# Patient Record
Sex: Female | Born: 1995 | Hispanic: Yes | Smoking: Former smoker
Health system: Southern US, Community
[De-identification: ages and names within clinical notes are randomized; demographics above are authoritative.]

## PROBLEM LIST (undated history)

## (undated) DIAGNOSIS — Z789 Other specified health status: Secondary | ICD-10-CM

## (undated) HISTORY — PX: NO PAST SURGERIES: SHX2092

---

## 2016-05-21 ENCOUNTER — Emergency Department (HOSPITAL_COMMUNITY): Payer: No Typology Code available for payment source

## 2016-05-21 ENCOUNTER — Encounter (HOSPITAL_COMMUNITY): Payer: Self-pay | Admitting: Emergency Medicine

## 2016-05-21 ENCOUNTER — Emergency Department (HOSPITAL_COMMUNITY)
Admission: EM | Admit: 2016-05-21 | Discharge: 2016-05-21 | Disposition: A | Discharge status: Home / Self Care | Payer: No Typology Code available for payment source | Attending: Emergency Medicine | Admitting: Emergency Medicine

## 2016-05-21 DIAGNOSIS — Y939 Activity, unspecified: Secondary | ICD-10-CM | POA: Diagnosis not present

## 2016-05-21 DIAGNOSIS — Y92410 Unspecified street and highway as the place of occurrence of the external cause: Secondary | ICD-10-CM | POA: Insufficient documentation

## 2016-05-21 DIAGNOSIS — Y999 Unspecified external cause status: Secondary | ICD-10-CM | POA: Diagnosis not present

## 2016-05-21 DIAGNOSIS — S0083XA Contusion of other part of head, initial encounter: Secondary | ICD-10-CM | POA: Diagnosis not present

## 2016-05-21 DIAGNOSIS — M542 Cervicalgia: Secondary | ICD-10-CM | POA: Insufficient documentation

## 2016-05-21 DIAGNOSIS — S0990XA Unspecified injury of head, initial encounter: Secondary | ICD-10-CM | POA: Diagnosis present

## 2016-05-21 MED ORDER — KETOROLAC TROMETHAMINE 30 MG/ML IJ SOLN
30.0000 mg | Freq: Once | INTRAMUSCULAR | Status: AC
  Administered 2016-05-21: 30 mg via INTRAMUSCULAR
  Filled 2016-05-21: qty 1

## 2016-05-21 MED ORDER — IBUPROFEN 800 MG PO TABS: 800.0000 mg | ORAL_TABLET | Freq: Three times a day (TID) | ORAL | Status: DC
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
## 2016-05-21 NOTE — ED Notes (Signed)
Patient transported to CT 
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
## 2016-05-21 NOTE — ED Notes (Signed)
Pt also with small abrasion to right elbow

## 2016-05-21 NOTE — ED Notes (Signed)
Patient transported to X-ray 
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
## 2016-10-14 ENCOUNTER — Emergency Department (HOSPITAL_COMMUNITY)
Admission: EM | Admit: 2016-10-14 | Discharge: 2016-10-15 | Disposition: A | Discharge status: Home / Self Care | Payer: Medicaid Other | Attending: Emergency Medicine | Admitting: Emergency Medicine

## 2016-10-14 ENCOUNTER — Encounter (HOSPITAL_COMMUNITY): Payer: Self-pay | Admitting: Adult Health

## 2016-10-14 DIAGNOSIS — T40601A Poisoning by unspecified narcotics, accidental (unintentional), initial encounter: Secondary | ICD-10-CM

## 2016-10-14 DIAGNOSIS — F17200 Nicotine dependence, unspecified, uncomplicated: Secondary | ICD-10-CM | POA: Diagnosis not present

## 2016-10-14 DIAGNOSIS — T404X1A Poisoning by other synthetic narcotics, accidental (unintentional), initial encounter: Secondary | ICD-10-CM | POA: Diagnosis not present

## 2016-10-14 LAB — CBC WITH DIFFERENTIAL/PLATELET
Basophils Absolute: 0 10*3/uL (ref 0.0–0.1)
Basophils Relative: 0 %
EOS ABS: 0 10*3/uL (ref 0.0–0.7)
EOS PCT: 0 %
HCT: 37 % (ref 36.0–46.0)
Hemoglobin: 12.7 g/dL (ref 12.0–15.0)
LYMPHS ABS: 3.2 10*3/uL (ref 0.7–4.0)
Lymphocytes Relative: 27 %
MCH: 30.2 pg (ref 26.0–34.0)
MCHC: 34.3 g/dL (ref 30.0–36.0)
MCV: 88.1 fL (ref 78.0–100.0)
MONOS PCT: 6 %
Monocytes Absolute: 0.7 10*3/uL (ref 0.1–1.0)
Neutro Abs: 7.8 10*3/uL — ABNORMAL HIGH (ref 1.7–7.7)
Neutrophils Relative %: 67 %
PLATELETS: 336 10*3/uL (ref 150–400)
RBC: 4.2 MIL/uL (ref 3.87–5.11)
RDW: 13.2 % (ref 11.5–15.5)
WBC: 11.8 10*3/uL — AB (ref 4.0–10.5)

## 2016-10-14 LAB — COMPREHENSIVE METABOLIC PANEL
ALK PHOS: 64 U/L (ref 38–126)
ALT: 16 U/L (ref 14–54)
AST: 21 U/L (ref 15–41)
Albumin: 4.5 g/dL (ref 3.5–5.0)
Anion gap: 9 (ref 5–15)
BUN: 10 mg/dL (ref 6–20)
CALCIUM: 9.5 mg/dL (ref 8.9–10.3)
CHLORIDE: 104 mmol/L (ref 101–111)
CO2: 23 mmol/L (ref 22–32)
CREATININE: 0.74 mg/dL (ref 0.44–1.00)
GFR calc Af Amer: >60 mL/min (ref >60)
Glucose, Bld: 125 mg/dL — ABNORMAL HIGH (ref 65–99)
Potassium: 3.5 mmol/L (ref 3.5–5.1)
Sodium: 136 mmol/L (ref 135–145)
Total Bilirubin: 0.4 mg/dL (ref 0.3–1.2)
Total Protein: 7.6 g/dL (ref 6.5–8.1)

## 2016-10-14 LAB — ETHANOL

## 2016-10-14 LAB — ACETAMINOPHEN LEVEL

## 2016-10-14 LAB — SALICYLATE LEVEL: Salicylate Lvl: <7 mg/dL (ref 2.8–30.0)

## 2016-10-14 MED ORDER — ONDANSETRON 4 MG PO TBDP
4.0000 mg | ORAL_TABLET | Freq: Once | ORAL | Status: AC
  Administered 2016-10-14: 4 mg via ORAL
  Filled 2016-10-14: qty 1

## 2016-10-14 MED ORDER — ONDANSETRON HCL 4 MG/2ML IJ SOLN
4.0000 mg | Freq: Once | INTRAMUSCULAR | Status: DC
Start: 2016-10-14 — End: 2016-10-14
  Filled 2016-10-14: qty 2
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
## 2016-10-14 NOTE — ED Notes (Signed)
Patient aware that urine is needed.  To call when able.

## 2016-10-15 LAB — POC URINE PREG, ED: Preg Test, Ur: NEGATIVE

## 2016-10-15 LAB — RAPID URINE DRUG SCREEN, HOSP PERFORMED
Amphetamines: NOT DETECTED
BARBITURATES: NOT DETECTED
Benzodiazepines: NOT DETECTED
COCAINE: NOT DETECTED
Opiates: NOT DETECTED
TETRAHYDROCANNABINOL: NOT DETECTED

## 2016-10-15 NOTE — ED Notes (Signed)
Spoke with poison control.  Updated on results at this time.  Recommends patient be watched until 0300 and they will call back then to clear patient.

## 2016-10-15 NOTE — ED Notes (Signed)
Pt ambulated to bathroom. Instructions for collecting urine sample verbalized.

## 2016-10-15 NOTE — Discharge Instructions (Signed)
Do not take medication that was prescribed for someone else. When you take prescription medication, take it exactly according to the directions given to you.  For your back pain, take ibuprofen, naproxen, or acetaminophen. Apply ice to the area that is painful.
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
## 2021-04-28 ENCOUNTER — Other Ambulatory Visit: Payer: Self-pay

## 2021-04-28 ENCOUNTER — Encounter: Payer: Self-pay | Admitting: Advanced Practice Midwife

## 2021-04-28 ENCOUNTER — Ambulatory Visit: Payer: Self-pay | Admitting: Advanced Practice Midwife

## 2021-04-28 VITALS — BP 118/77 | HR 74 | Ht 63.0 in | Wt 152.0 lb

## 2021-04-28 DIAGNOSIS — B379 Candidiasis, unspecified: Secondary | ICD-10-CM

## 2021-04-28 MED ORDER — FLUCONAZOLE 150 MG PO TABS: ORAL_TABLET | ORAL | 2 refills | Status: DC

## 2021-04-28 MED ORDER — FLUCONAZOLE 150 MG PO TABS: 150.0000 mg | ORAL_TABLET | Freq: Once | ORAL | 0 refills | Status: DC

## 2021-04-28 NOTE — Progress Notes (Signed)
   [facility] Visit  Patient name: Kathleen Schmidt MRN [id number]  Date of birth: Apr 14, 1996  CC & HPI:   25 y.o. female complains of white and curd-like vaginal discharge for about a month . Denies abnormal vaginal bleeding, significant pelvic pain or fever. No UTI symptoms. Sexually active, does not use condoms, no change in partner.  Last unprotected intercourse __ days ago.  Denies history of known exposure to STD or symptoms in partner.  Patient's last menstrual period was 04/01/2021 (exact date).  No history of STD's.  Pertinent History Reviewed:  Medical & Surgical Hx:   History reviewed. No pertinent past medical history. History reviewed. No pertinent surgical history. Family History  Problem Relation Age of Onset  . Diabetes Maternal Grandmother   . Stroke Maternal Grandfather   . Kidney failure Mother   . Hypertension Mother   . Diabetes Mother   . Hypercholesterolemia Mother     Current Outpatient Medications:  .  ibuprofen (ADVIL,MOTRIN) 800 MG tablet, Take 1 tablet (800 mg total) by mouth 3 (three) times daily. Take one tablet three times daily for three days, Disp: 15 tablet, Rfl: 0 Social History: Reviewed -  reports that she quit smoking about 3 years ago. She has never used smokeless tobacco.  Review of Systems:   Constitutional: Negative for fever and chills Eyes: Negative for visual disturbances Respiratory: Negative for shortness of breath, dyspnea Cardiovascular: Negative for chest pain or palpitations  Gastrointestinal: Negative for vomiting, diarrhea and constipation; no abdominal pain Genitourinary: Negative for dysuria and urgency Musculoskeletal: Negative for back pain, joint pain, myalgias  Neurological: Negative for dizziness and headaches    Objective Findings:  Vitals: BP 118/77 (BP Location: Right Arm, Patient Position: Sitting, Cuff Size: Normal)   Pulse 74   Ht 5\' 3"  (1.6 m)   Wt 152 lb (68.9 kg)   LMP 04/01/2021 (Exact Date)    BMI 26.93 kg/m   Physical Examination: General appearance - well appearing, and in no distress Mental status - alert, oriented to person, place, and time Chest:  Normal respiratory effort Heart - normal rate and regular rhythm Abdomen:  Soft, nontender Pelvic: Vulva neg, SSE:  Thick white discharge with no odor.  Wet prep  yeast, no clue, no WBC, no trich. Musculoskeletal:  Normal range of motion without pain Extremities:  No edema    No results found for this or any previous visit (from the past 24 hour(s)).    Assessment & Plan:  A:   Yeast P:  rx diflucan   No follow-ups on file.  04/03/2021 CNM 04/28/2021 12:03 PM

## 2021-05-24 ENCOUNTER — Telehealth: Payer: Self-pay | Admitting: Advanced Practice Midwife
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
## 2021-05-24 NOTE — Telephone Encounter (Signed)
Call can't be completed @ 1:25 pm. JSY

## 2021-05-25 NOTE — Telephone Encounter (Signed)
Call can't be completed @ 12:40 pm. JSY

## 2021-05-26 NOTE — Telephone Encounter (Signed)
Call can't be completed @ 11:44 am. 3 attempts to reach pt. Closing encounter. JSY

## 2021-05-30 ENCOUNTER — Telehealth: Payer: Self-pay | Admitting: Advanced Practice Midwife

## 2021-05-30 NOTE — Telephone Encounter (Signed)
PT CALLING STATING THAT SHE HAS COMPLETED HER MEDS  BUT DIDN'T CLEAR UP WANTING TO KNOW IS SOMEONE WILL CALL HER BACK OR SEND SOMETHING IN TO [facility]

## 2021-05-30 NOTE — Telephone Encounter (Signed)
Attempted to return patient's call but call could not be completed at this time. Will try again tomorrow.

## 2021-05-31 NOTE — Telephone Encounter (Signed)
Attempted to return patient's call but heard "call cannot be completed at this time.

## 2021-06-02 ENCOUNTER — Telehealth: Payer: Self-pay | Admitting: Advanced Practice Midwife

## 2021-06-02 NOTE — Telephone Encounter (Signed)
Patient states that she called two days in a roll with no nurse calling her back, I spoke with patient and informed her that my nurse has tried to call and it did not go through. Paitent gave a new number. Please contact pt

## 2021-06-02 NOTE — Telephone Encounter (Signed)
Returned patient's call. States same symptoms as last time.  Informed she has 2 refills left on last prescription that was sent and can give the pharmacy a call to have it refilled.  Pt verbalized understanding.

## 2021-12-04 NOTE — L&D Delivery Note (Signed)
Delivery Note Progressed to complete dilation and pushed for 40 minutes to delivery Neonatologist called to attend delivery due to MSAF  At 4:33 AM a viable and healthy female was delivered via Vaginal, Spontaneous (Presentation: Left Occiput Anterior).  APGAR: 8, 9; weight  .   Placenta status: Spontaneous, Intact.  Cord: 3 vessels with the following complications: None.   Baby did well and was deemed healthy by Neonatologist to stay in room with parents  Anesthesia: Epidural Episiotomy: None Lacerations: 1st degree;Perineal, small periclitoral  Suture Repair: 3.0 monocryl  Est. Blood Loss (mL): 218  Mom to postpartum.  Baby to Couplet care / Skin to Skin.  Wynelle Bourgeois 07/14/2022, 5:27 AM

## 2021-12-06 ENCOUNTER — Ambulatory Visit (INDEPENDENT_AMBULATORY_CARE_PROVIDER_SITE_OTHER): Payer: Self-pay

## 2021-12-06 ENCOUNTER — Other Ambulatory Visit: Payer: Self-pay

## 2021-12-06 VITALS — BP 112/72 | HR 59 | Ht 63.0 in | Wt 148.4 lb

## 2021-12-06 DIAGNOSIS — Z3200 Encounter for pregnancy test, result unknown: Secondary | ICD-10-CM

## 2021-12-06 DIAGNOSIS — Z3201 Encounter for pregnancy test, result positive: Secondary | ICD-10-CM

## 2021-12-06 LAB — POCT URINE PREGNANCY: Preg Test, Ur: POSITIVE — AB

## 2021-12-06 NOTE — Progress Notes (Signed)
° °  NURSE VISIT- PREGNANCY CONFIRMATION   SUBJECTIVE:  Kathleen Schmidt is a 26 y.o. G1P0000 female at [redacted]w[redacted]d by certain LMP of Patient's last menstrual period was 10/08/2021 (exact date). Here for pregnancy confirmation.  Home pregnancy test: positive x 3   She reports nausea and vomiting.  She is not taking prenatal vitamins.    OBJECTIVE:  BP 112/72 (BP Location: Right Arm, Patient Position: Sitting, Cuff Size: Normal)    Pulse (!) 59    Ht 5\' 3"  (1.6 m)    Wt 148 lb 6.4 oz (67.3 kg)    LMP 10/08/2021 (Exact Date)    BMI 26.29 kg/m   Appears well, in no apparent distress  Results for orders placed or performed in visit on 12/06/21 (from the past 24 hour(s))  POCT urine pregnancy   Collection Time: 12/06/21  3:33 PM  Result Value Ref Range   Preg Test, Ur Positive (A) Negative    ASSESSMENT: Positive pregnancy test, [redacted]w[redacted]d by LMP    PLAN: Schedule for dating ultrasound in 1-2 weeks Prenatal vitamins: plans to begin OTC ASAP   Nausea medicines: not currently needed   OB packet given: Yes  Kimla Furth A Jayvan Mcshan  12/06/2021 3:39 PM

## 2021-12-06 NOTE — Progress Notes (Signed)
Chart reviewed for nurse visit. Agree with plan of care.  Adline Potter, NP 12/06/2021 4:59 PM

## 2021-12-17 ENCOUNTER — Inpatient Hospital Stay (HOSPITAL_COMMUNITY)
Admission: AD | Admit: 2021-12-17 | Discharge: 2021-12-17 | Disposition: A | Discharge status: Home / Self Care | Payer: Medicaid Other | Attending: Obstetrics and Gynecology | Admitting: Obstetrics and Gynecology

## 2021-12-17 ENCOUNTER — Other Ambulatory Visit: Payer: Self-pay

## 2021-12-17 ENCOUNTER — Encounter (HOSPITAL_COMMUNITY): Payer: Self-pay | Admitting: Family Medicine

## 2021-12-17 DIAGNOSIS — O26891 Other specified pregnancy related conditions, first trimester: Secondary | ICD-10-CM | POA: Insufficient documentation

## 2021-12-17 DIAGNOSIS — Z3A10 10 weeks gestation of pregnancy: Secondary | ICD-10-CM | POA: Diagnosis not present

## 2021-12-17 DIAGNOSIS — M79605 Pain in left leg: Secondary | ICD-10-CM | POA: Diagnosis present

## 2021-12-17 DIAGNOSIS — R252 Cramp and spasm: Secondary | ICD-10-CM

## 2021-12-17 DIAGNOSIS — O99891 Other specified diseases and conditions complicating pregnancy: Secondary | ICD-10-CM

## 2021-12-17 HISTORY — DX: Other specified health status: Z78.9

## 2021-12-17 NOTE — MAU Provider Note (Addendum)
History     CSN: [id number]  Arrival date and time: 12/17/21 0042   Event Date/Time   First Provider Initiated Contact with Patient 12/17/21 0102      Chief Complaint  Patient presents with   Leg Pain   HPI Angelinne Lecates is a 26 y.o. G1P0000 at [redacted]w[redacted]d who presents to MAU with chief complaint of left leg "cramp". This is a new problem. Onset the morning of 12/16/2021. Patient states it feels as if her left calf is "about to have a cramp". She spent Friday running errands with her mom which involved a lot of time in the car. She states her left calf became more uncomfortable throughout the day. She denies aggravating or alleviating factors. She has not taken medication or tried other treatments for this complaint.  She denies unilateral swelling, calf discoloration, recent immobilization, SOB, chest pain, weakness, syncope.  Patient has confirmed pregnancy with [facility].  OB History     Gravida  1   Para  0   Term  0   Preterm  0   AB  0   Living  0      SAB  0   IAB  0   Ectopic  0   Multiple  0   Live Births  0           Past Medical History:  Diagnosis Date   Medical history non-contributory     Past Surgical History:  Procedure Laterality Date   NO PAST SURGERIES      Family History  Problem Relation Age of Onset   Diabetes Maternal Grandmother    Stroke Maternal Grandfather    Kidney failure Mother    Hypertension Mother    Diabetes Mother    Hypercholesterolemia Mother     Social History   Tobacco Use   Smoking status: Former    Types: Cigarettes    Quit date: 2019    Years since quitting: 4.0   Smokeless tobacco: Never  Vaping Use   Vaping Use: Never used  Substance Use Topics   Alcohol use: No   Drug use: No    Allergies: No Known Allergies  No medications prior to admission.    Review of Systems  Musculoskeletal:  Positive for gait problem.  All other systems reviewed and are negative. Physical Exam    Blood pressure 120/81, pulse 70, temperature 98.3 F (36.8 C), resp. rate 17, height 5\' 3"  (1.6 m), weight 66.2 kg, last menstrual period 10/08/2021, SpO2 100 %.  Physical Exam Vitals and nursing note reviewed.  Constitutional:      Appearance: Normal appearance.  Cardiovascular:     Rate and Rhythm: Normal rate.     Pulses: Normal pulses.  Pulmonary:     Effort: Pulmonary effort is normal.  Abdominal:     General: Abdomen is flat.  Musculoskeletal:     Right lower leg: Normal. No swelling. No edema.     Left lower leg: Normal. No swelling. No edema.     Right ankle: No swelling.     Right Achilles Tendon: Normal.     Left ankle: Normal. No swelling.     Left Achilles Tendon: Normal.     Right foot: Normal.     Left foot: Normal.  Skin:    Capillary Refill: Capillary refill takes less than 2 seconds.  Neurological:     Mental Status: She is alert and oriented to person, place, and time.  Psychiatric:  Mood and Affect: Mood normal.        Behavior: Behavior normal.        Thought Content: Thought content normal.        Judgment: Judgment normal.   Measurements symmetrical at distal tibial tuberosity, widest part of calf, and distal tibia  MAU Course  Procedures  --Pertinent negatives: unilateral swelling, discoloration, elevated BMI, recent immobilization, SOB --CNM at bedside to discuss Wells Score of 0, low suspicion of DVT. Also reviewed MAU overnight protocol for ordering Doppler for administration later this morning, Lovenox prior to discharge from MAU. --Patient and partner decline Lovenox, decline order for Doppler imaging later this morning --Also discussed date for muscle cramps and interventions. Per Up To Date gentle stretching advised over placebo affect associated with magnesium supplementation  Patient Vitals for the past 24 hrs:  BP Temp Pulse Resp SpO2 Height Weight  12/17/21 0134 110/68 -- 79 16 -- -- --  12/17/21 0054 120/81 98.3 F (36.8 C)  70 17 100 % 5\' 3"  (1.6 m) 66.2 kg   Pretest probability of deep vein thrombosis (Wells score) Clinical feature Score  Active cancer (treatment ongoing or within the previous six months or palliative) 1  Paralysis, paresis, or recent plaster immobilization of the lower extremities 1  Recently bedridden for more than three days or major surgery, within four weeks 1  Localized tenderness along the distribution of the deep venous system 1  Entire leg swollen 1  Calf swelling by more than 3 cm when compared to the asymptomatic leg (measured below tibial tuberosity) 1  Pitting edema (greater in the symptomatic leg) 1  Collateral superficial veins (nonvaricose) 1  Alternative diagnosis as likely or more likely than that of deep venous thrombosis -2  Score  High probability  3 or greater  Moderate probability 1 or 2  Low probability 0 or less  Modification:  This clinical model has been modified to take one other clinical feature into account: a previously documented deep vein thrombosis (DVT) is given the score of 1. Using this modified scoring system, DVT is either likely or unlikely, as follows:  DVT likely 2 or greater   DVT unlikely 1 or less  Adapted from: Lendon Collar, Anderson DR, Bormanis J, et al. Value of assessment of pretest probability of deep-vein thrombosis in clinical management. Lancet 1997; [id number] Wells PS, Anderson,DR, Lilia Argue, et al. Evaluation of D-dimer in the diagnosis of suspected deep-vein thrombosis. [place] [employer] Med 2003; [id number].  Assessment and Plan  --26 y.o. G1P0000 at [redacted]w[redacted]d  --Left calf cramps --Patient declines Lovenox and Doppler imaging --Discharge home in stable condition with return precautions  Darlina Rumpf, [place] 12/17/2021, 1:54 AM

## 2021-12-17 NOTE — MAU Note (Signed)
This morning my L leg was hurting and felt like it was going to cramp up. Leg continued to hurt all day in my left calf. Worse with walking. No pregnancy concerns

## 2021-12-17 NOTE — Progress Notes (Signed)
Thalia Bloodgood CNM in earlier to discuss plan of care. Written and verbal d/c instructions given and understanding voiced

## 2021-12-21 ENCOUNTER — Other Ambulatory Visit: Payer: Self-pay | Admitting: Obstetrics & Gynecology

## 2021-12-21 DIAGNOSIS — O3680X0 Pregnancy with inconclusive fetal viability, not applicable or unspecified: Secondary | ICD-10-CM

## 2021-12-22 ENCOUNTER — Other Ambulatory Visit: Payer: Self-pay

## 2021-12-22 ENCOUNTER — Ambulatory Visit (INDEPENDENT_AMBULATORY_CARE_PROVIDER_SITE_OTHER): Payer: Medicaid Other

## 2021-12-22 DIAGNOSIS — Z3A10 10 weeks gestation of pregnancy: Secondary | ICD-10-CM | POA: Diagnosis not present

## 2021-12-22 DIAGNOSIS — O3680X0 Pregnancy with inconclusive fetal viability, not applicable or unspecified: Secondary | ICD-10-CM

## 2021-12-22 NOTE — Progress Notes (Signed)
[place] 10+5 wks,single IUP with YS,CRL 35.36 mm,FHR 164 bpm,normal ovaries

## 2022-01-03 DIAGNOSIS — Z3400 Encounter for supervision of normal first pregnancy, unspecified trimester: Secondary | ICD-10-CM | POA: Insufficient documentation

## 2022-01-04 ENCOUNTER — Other Ambulatory Visit: Payer: Self-pay | Admitting: Obstetrics & Gynecology

## 2022-01-04 DIAGNOSIS — Z3682 Encounter for antenatal screening for nuchal translucency: Secondary | ICD-10-CM

## 2022-01-06 ENCOUNTER — Encounter: Payer: Self-pay | Admitting: Women's Health

## 2022-01-06 ENCOUNTER — Other Ambulatory Visit: Payer: Self-pay

## 2022-01-06 ENCOUNTER — Ambulatory Visit (INDEPENDENT_AMBULATORY_CARE_PROVIDER_SITE_OTHER): Payer: Medicaid Other | Admitting: Women's Health

## 2022-01-06 ENCOUNTER — Ambulatory Visit (INDEPENDENT_AMBULATORY_CARE_PROVIDER_SITE_OTHER): Payer: Medicaid Other

## 2022-01-06 ENCOUNTER — Ambulatory Visit: Payer: Medicaid Other | Admitting: *Deleted

## 2022-01-06 VITALS — BP 114/54 | HR 68 | Wt 149.0 lb

## 2022-01-06 DIAGNOSIS — Z131 Encounter for screening for diabetes mellitus: Secondary | ICD-10-CM | POA: Diagnosis not present

## 2022-01-06 DIAGNOSIS — Z3682 Encounter for antenatal screening for nuchal translucency: Secondary | ICD-10-CM

## 2022-01-06 DIAGNOSIS — Z3401 Encounter for supervision of normal first pregnancy, first trimester: Secondary | ICD-10-CM

## 2022-01-06 DIAGNOSIS — Z3402 Encounter for supervision of normal first pregnancy, second trimester: Secondary | ICD-10-CM

## 2022-01-06 DIAGNOSIS — Z3A12 12 weeks gestation of pregnancy: Secondary | ICD-10-CM | POA: Diagnosis not present

## 2022-01-06 DIAGNOSIS — Z6826 Body mass index (BMI) 26.0-26.9, adult: Secondary | ICD-10-CM | POA: Diagnosis not present

## 2022-01-06 NOTE — Addendum Note (Signed)
Addended by: Moss Mc on: 01/06/2022 01:08 PM   Modules accepted: Orders

## 2022-01-06 NOTE — Patient Instructions (Signed)
Kathleen Schmidt, thank you for choosing our office today! We appreciate the opportunity to meet your healthcare needs. You may receive a short survey by mail, e-mail, or through [employer]. If you are happy with your care we would appreciate if you could take just a few minutes to complete the survey questions. We read all of your comments and take your feedback very seriously. Thank you again for choosing our office.  Center for [employer] [facility] at [facility]  [name] at [facility] ([address] [place], [place] [address]) Entrance C, located off of [address] Free 24/7 valet parking   Nausea & Vomiting Have saltine crackers or pretzels by your bed and eat a few bites before you raise your head out of bed in the morning Eat small frequent meals throughout the day instead of large meals Drink plenty of fluids throughout the day to stay hydrated, just don't drink a lot of fluids with your meals.  This can make your stomach fill up faster making you feel sick Do not brush your teeth right after you eat Products with real ginger are good for nausea, like ginger ale and ginger hard candy Make sure it says made with real ginger! Sucking on sour candy like lemon heads is also good for nausea If your prenatal vitamins make you nauseated, take them at night so you will sleep through the nausea Sea Bands If you feel like you need medicine for the nausea & vomiting please let us know If you are unable to keep any fluids or food down please let us know   Constipation Drink plenty of fluid, preferably water, throughout the day Eat foods high in fiber such as fruits, vegetables, and grains Exercise, such as walking, is a good way to keep your bowels regular Drink warm fluids, especially warm prune juice, or decaf coffee Eat a 1/2 cup of real oatmeal (not instant), 1/2 cup applesauce, and 1/2-1 cup warm prune juice every day If needed, you may take Colace (docusate sodium) stool softener  once or twice a day to help keep the stool soft.  If you still are having problems with constipation, you may take Miralax once daily as needed to help keep your bowels regular.   Home Blood Pressure Monitoring for Patients   Your provider has recommended that you check your blood pressure (BP) at least once a week at home. If you do not have a blood pressure cuff at home, one will be provided for you. Contact your provider if you have not received your monitor within 1 week.   Helpful Tips for Accurate Home Blood Pressure Checks  Don't smoke, exercise, or drink caffeine 30 minutes before checking your BP Use the restroom before checking your BP (a full bladder can raise your pressure) Relax in a comfortable upright chair Feet on the ground Left arm resting comfortably on a flat surface at the level of your heart Legs uncrossed Back supported Sit quietly and don't talk Place the cuff on your bare arm Adjust snuggly, so that only two fingertips can fit between your skin and the top of the cuff Check 2 readings separated by at least one minute Keep a log of your BP readings For a visual, please reference this diagram: http://ccnc.care/bpdiagram  Provider Name: [facility]     Phone: [phone]  Zone 1: ALL CLEAR  Continue to monitor your symptoms:  BP reading is less than 140 (top number) or less than 90 (bottom  number)  No right upper stomach pain No headaches or seeing spots No feeling nauseated or throwing up No swelling in face and hands  Zone 2: CAUTION Call your doctor's office for any of the following:  BP reading is greater than 140 (top number) or greater than 90 (bottom number)  Stomach pain under your ribs in the middle or right side Headaches or seeing spots Feeling nauseated or throwing up Swelling in face and hands  Zone 3: EMERGENCY  Seek immediate medical care if you have any of the following:  BP reading is greater than160 (top number) or greater than  110 (bottom number) Severe headaches not improving with Tylenol Serious difficulty catching your breath Any worsening symptoms from Zone 2    First Trimester of Pregnancy The first trimester of pregnancy is from week 1 until the end of week 12 (months 1 through 3). A week after a sperm fertilizes an egg, the egg will implant on the wall of the uterus. This embryo will begin to develop into a baby. Genes from you and your partner are forming the baby. The female genes determine whether the baby is a boy or a girl. At 6-8 weeks, the eyes and face are formed, and the heartbeat can be seen on ultrasound. At the end of 12 weeks, all the baby's organs are formed.  Now that you are pregnant, you will want to do everything you can to have a healthy baby. Two of the most important things are to get good prenatal care and to follow your health care provider's instructions. Prenatal care is all the medical care you receive before the baby's birth. This care will help prevent, find, and treat any problems during the pregnancy and childbirth. BODY CHANGES Your body goes through many changes during pregnancy. The changes vary from woman to woman.  You may gain or lose a couple of pounds at first. You may feel sick to your stomach (nauseous) and throw up (vomit). If the vomiting is uncontrollable, call your health care provider. You may tire easily. You may develop headaches that can be relieved by medicines approved by your health care provider. You may urinate more often. Painful urination may mean you have a bladder infection. You may develop heartburn as a result of your pregnancy. You may develop constipation because certain hormones are causing the muscles that push waste through your intestines to slow down. You may develop hemorrhoids or swollen, bulging veins (varicose veins). Your breasts may begin to grow larger and become tender. Your nipples may stick out more, and the tissue that surrounds them  (areola) may become darker. Your gums may bleed and may be sensitive to brushing and flossing. Dark spots or blotches (chloasma, mask of pregnancy) may develop on your face. This will likely fade after the baby is born. Your menstrual periods will stop. You may have a loss of appetite. You may develop cravings for certain kinds of food. You may have changes in your emotions from day to day, such as being excited to be pregnant or being concerned that something may go wrong with the pregnancy and baby. You may have more vivid and strange dreams. You may have changes in your hair. These can include thickening of your hair, rapid growth, and changes in texture. Some women also have hair loss during or after pregnancy, or hair that feels dry or thin. Your hair will most likely return to normal after your baby is born. WHAT TO EXPECT AT YOUR PRENATAL   VISITS During a routine prenatal visit: You will be weighed to make sure you and the baby are growing normally. Your blood pressure will be taken. Your abdomen will be measured to track your baby's growth. The fetal heartbeat will be listened to starting around week 10 or 12 of your pregnancy. Test results from any previous visits will be discussed. Your health care provider may ask you: How you are feeling. If you are feeling the baby move. If you have had any abnormal symptoms, such as leaking fluid, bleeding, severe headaches, or abdominal cramping. If you have any questions. Other tests that may be performed during your first trimester include: Blood tests to find your blood type and to check for the presence of any previous infections. They will also be used to check for low iron levels (anemia) and Rh antibodies. Later in the pregnancy, blood tests for diabetes will be done along with other tests if problems develop. Urine tests to check for infections, diabetes, or protein in the urine. An ultrasound to confirm the proper growth and development  of the baby. An amniocentesis to check for possible genetic problems. Fetal screens for spina bifida and Down syndrome. You may need other tests to make sure you and the baby are doing well. HOME CARE INSTRUCTIONS  Medicines Follow your health care provider's instructions regarding medicine use. Specific medicines may be either safe or unsafe to take during pregnancy. Take your prenatal vitamins as directed. If you develop constipation, try taking a stool softener if your health care provider approves. Diet Eat regular, well-balanced meals. Choose a variety of foods, such as meat or vegetable-based protein, fish, milk and low-fat dairy products, vegetables, fruits, and whole grain breads and cereals. Your health care provider will help you determine the amount of weight gain that is right for you. Avoid raw meat and uncooked cheese. These carry germs that can cause birth defects in the baby. Eating four or five small meals rather than three large meals a day may help relieve nausea and vomiting. If you start to feel nauseous, eating a few soda crackers can be helpful. Drinking liquids between meals instead of during meals also seems to help nausea and vomiting. If you develop constipation, eat more high-fiber foods, such as fresh vegetables or fruit and whole grains. Drink enough fluids to keep your urine clear or pale yellow. Activity and Exercise Exercise only as directed by your health care provider. Exercising will help you: Control your weight. Stay in shape. Be prepared for labor and delivery. Experiencing pain or cramping in the lower abdomen or low back is a good sign that you should stop exercising. Check with your health care provider before continuing normal exercises. Try to avoid standing for long periods of time. Move your legs often if you must stand in one place for a long time. Avoid heavy lifting. Wear low-heeled shoes, and practice good posture. You may continue to have sex  unless your health care provider directs you otherwise. Relief of Pain or Discomfort Wear a good support bra for breast tenderness.   Take warm sitz baths to soothe any pain or discomfort caused by hemorrhoids. Use hemorrhoid cream if your health care provider approves.   Rest with your legs elevated if you have leg cramps or low back pain. If you develop varicose veins in your legs, wear support hose. Elevate your feet for 15 minutes, 3-4 times a day. Limit salt in your diet. Prenatal Care Schedule your prenatal visits by the   twelfth week of pregnancy. They are usually scheduled monthly at first, then more often in the last 2 months before delivery. Write down your questions. Take them to your prenatal visits. Keep all your prenatal visits as directed by your health care provider. Safety Wear your seat belt at all times when driving. Make a list of emergency phone numbers, including numbers for family, friends, the hospital, and police and fire departments. General Tips Ask your health care provider for a referral to a local prenatal education class. Begin classes no later than at the beginning of month 6 of your pregnancy. Ask for help if you have counseling or nutritional needs during pregnancy. Your health care provider can offer advice or refer you to specialists for help with various needs. Do not use hot tubs, steam rooms, or saunas. Do not douche or use tampons or scented sanitary pads. Do not cross your legs for long periods of time. Avoid cat litter boxes and soil used by cats. These carry germs that can cause birth defects in the baby and possibly loss of the fetus by miscarriage or stillbirth. Avoid all smoking, herbs, alcohol, and medicines not prescribed by your health care provider. Chemicals in these affect the formation and growth of the baby. Schedule a dentist appointment. At home, brush your teeth with a soft toothbrush and be gentle when you floss. SEEK MEDICAL CARE IF:   You have dizziness. You have mild pelvic cramps, pelvic pressure, or nagging pain in the abdominal area. You have persistent nausea, vomiting, or diarrhea. You have a bad smelling vaginal discharge. You have pain with urination. You notice increased swelling in your face, hands, legs, or ankles. SEEK IMMEDIATE MEDICAL CARE IF:  You have a fever. You are leaking fluid from your vagina. You have spotting or bleeding from your vagina. You have severe abdominal cramping or pain. You have rapid weight gain or loss. You vomit blood or material that looks like coffee grounds. You are exposed to German measles and have never had them. You are exposed to fifth disease or chickenpox. You develop a severe headache. You have shortness of breath. You have any kind of trauma, such as from a fall or a car accident. Document Released: 11/14/2001 Document Revised: 04/06/2014 Document Reviewed: 09/30/2013 Exit[employer] Patient Information 2015 [employer]. This information is not intended to replace advice given to you by your health care provider. Make sure you discuss any questions you have with your health care provider.  

## 2022-01-06 NOTE — Progress Notes (Signed)
[place] 12+6 wks,measurements c/w dates,CRL 58.45 mm,NB present,NT 1.4 mm,posterior placenta,normal ovaries,fhr 167 bpm

## 2022-01-06 NOTE — Progress Notes (Signed)
INITIAL OBSTETRICAL VISIT Patient name: Kathleen Schmidt MRN [id number]  Date of birth: 03-Sep-1996 Chief Complaint:   Initial Prenatal Visit  History of Present Illness:   Kathleen Schmidt is a 26 y.o. G24P0000 Hispanic female at [redacted]w[redacted]d by LMP c/w u/s at 10 weeks with an Estimated Date of Delivery: 07/15/22 being seen today for her initial obstetrical visit.   Patient's last menstrual period was 10/08/2021 (exact date). Her obstetrical history is significant for primigravida.   Today she reports nausea- declines meds Last pap never. Results were:  n/a, wants to do pap next visit  Depression screen [facility] 2/9 01/06/2022 04/28/2021  Decreased Interest 1 0  Down, Depressed, Hopeless 1 0  PHQ - 2 Score 2 0  Altered sleeping 0 0  Tired, decreased energy 2 0  Change in appetite 2 1  Feeling bad or failure about yourself  1 0  Trouble concentrating 0 0  Moving slowly or fidgety/restless 0 0  Suicidal thoughts 0 0  PHQ-9 Score 7 1     GAD 7 : Generalized Anxiety Score 04/28/2021  Nervous, Anxious, on Edge 0  Control/stop worrying 0  Worry too much - different things 0  Trouble relaxing 0  Restless 0  Easily annoyed or irritable 0  Afraid - awful might happen 0  Total GAD 7 Score 0     Review of Systems:   Pertinent items are noted in HPI Denies cramping/contractions, leakage of fluid, vaginal bleeding, abnormal vaginal discharge w/ itching/odor/irritation, headaches, visual changes, shortness of breath, chest pain, abdominal pain, severe nausea/vomiting, or problems with urination or bowel movements unless otherwise stated above.  Pertinent History Reviewed:  Reviewed past medical,surgical, social, obstetrical and family history.  Reviewed problem list, medications and allergies. OB History  Gravida Para Term Preterm AB Living  1 0 0 0 0 0  SAB IAB Ectopic Multiple Live Births  0 0 0 0 0    # Outcome Date GA Lbr Len/2nd Weight Sex Delivery Anes PTL Lv  1 Current            Physical  Assessment:   Vitals:   01/06/22 0909  BP: (!) 114/54  Pulse: 68  Weight: 149 lb (67.6 kg)  Body mass index is 26.39 kg/m.       Physical Examination:  General appearance - well appearing, and in no distress  Mental status - alert, oriented to person, place, and time  Psych:  She has a normal mood and affect  Skin - warm and dry, normal color, no suspicious lesions noted  Chest - effort normal, all lung fields clear to auscultation bilaterally  Heart - normal rate and regular rhythm  Abdomen - soft, nontender  Extremities:  No swelling or varicosities noted  Thin prep pap is not done- pt wants to do next visit  Chaperone: N/A    TODAY'S NT scheduled for later this morning  No results found for this or any previous visit (from the past 24 hour(s)).  Assessment & Plan:  1) Low-Risk Pregnancy G1P0000 at [redacted]w[redacted]d with an Estimated Date of Delivery: 07/15/22   2) Initial OB visit  3) Past due for pap> wants to do next visit  Meds: No orders of the defined types were placed in this encounter.   Initial labs obtained Continue prenatal vitamins Reviewed n/v relief measures and warning s/s to report Reviewed recommended weight gain based on pre-gravid BMI Encouraged well-balanced diet Genetic & carrier screening discussed: requests Panorama and NT/IT, declines Horizon  Ultrasound discussed; fetal survey: requested CCNC completed> form faxed if has or is planning to apply for medicaid The nature of [facility] [facility] [employer] with multiple MDs and other Advanced Practice Providers was explained to patient; also emphasized that fellows, residents, and students are part of our team. Does not have home bp cuff. Office bp cuff given: no. Rx sent: yes. Check bp weekly, let us know if consistently >140/90.   Follow-up: Return in about 4 weeks (around 02/03/2022) for LROB, 2nd IT, CNM, in person w/ pap.   Orders Placed This Encounter  Procedures   GC/Chlamydia Probe Amp    Urine Culture   CBC/D/Plt+RPR+Rh+ABO+RubIgG...   Genetic Screening   Hemoglobin A1c   Hgb Fractionation [place]   POC Urinalysis Dipstick OB    Cheral Marker CNM, [facility] 01/06/2022 10:10 AM

## 2022-01-08 LAB — URINE CULTURE

## 2022-01-09 LAB — CBC/D/PLT+RPR+RH+ABO+RUBIGG...
Antibody Screen: NEGATIVE
Basophils Absolute: 0 10*3/uL (ref 0.0–0.2)
Basos: 0 %
EOS (ABSOLUTE): 0.1 10*3/uL (ref 0.0–0.4)
Eos: 1 %
HCV Ab: <0.1 s/co ratio (ref 0.0–0.9)
HIV Screen 4th Generation wRfx: NONREACTIVE
Hematocrit: 36.6 % (ref 34.0–46.6)
Hemoglobin: 12.6 g/dL (ref 11.1–15.9)
Hepatitis B Surface Ag: NEGATIVE
Immature Grans (Abs): 0 10*3/uL (ref 0.0–0.1)
Immature Granulocytes: 0 %
Lymphocytes Absolute: 2.4 10*3/uL (ref 0.7–3.1)
Lymphs: 26 %
MCH: 31.4 pg (ref 26.6–33.0)
MCHC: 34.4 g/dL (ref 31.5–35.7)
MCV: 91 fL (ref 79–97)
Monocytes Absolute: 0.5 10*3/uL (ref 0.1–0.9)
Monocytes: 6 %
Neutrophils Absolute: 6.2 10*3/uL (ref 1.4–7.0)
Neutrophils: 67 %
Platelets: 369 10*3/uL (ref 150–450)
RBC: 4.01 x10E6/uL (ref 3.77–5.28)
RDW: 12.5 % (ref 11.7–15.4)
RPR Ser Ql: NONREACTIVE
Rh Factor: POSITIVE
Rubella Antibodies, IGG: 1.77 index (ref >0.99)
WBC: 9.2 10*3/uL (ref 3.4–10.8)

## 2022-01-09 LAB — HCV INTERPRETATION

## 2022-01-09 LAB — HGB FRACTIONATION CASCADE
Hgb A2: 2.6 % (ref 1.8–3.2)
Hgb A: 96.9 % (ref 96.4–98.8)
Hgb F: 0.5 % (ref 0.0–2.0)
Hgb S: 0 %

## 2022-01-09 LAB — HEMOGLOBIN A1C
Est. average glucose Bld gHb Est-mCnc: 108 mg/dL
Hgb A1c MFr Bld: 5.4 % (ref 4.8–5.6)

## 2022-01-10 LAB — INTEGRATED 1
Crown Rump Length: 59 mm
Gest. Age on Collection Date: 12.3 weeks
Maternal Age at EDD: 24.9 yr
Nuchal Translucency (NT): 1.4 mm
Number of Fetuses: 1
PAPP-A Value: 867.4 ng/mL
Weight: 149 [lb_av]

## 2022-01-10 LAB — GC/CHLAMYDIA PROBE AMP
Chlamydia trachomatis, NAA: NEGATIVE
Neisseria Gonorrhoeae by PCR: NEGATIVE

## 2022-01-16 ENCOUNTER — Encounter: Payer: Self-pay | Admitting: Women's Health

## 2022-02-03 ENCOUNTER — Other Ambulatory Visit: Payer: Self-pay

## 2022-02-03 ENCOUNTER — Ambulatory Visit (INDEPENDENT_AMBULATORY_CARE_PROVIDER_SITE_OTHER): Payer: Medicaid Other | Admitting: Obstetrics & Gynecology

## 2022-02-03 ENCOUNTER — Other Ambulatory Visit (HOSPITAL_COMMUNITY)
Admission: RE | Admit: 2022-02-03 | Discharge: 2022-02-03 | Disposition: A | Discharge status: Home / Self Care | Payer: Medicaid Other | Source: Ambulatory Visit | Attending: Obstetrics & Gynecology | Admitting: Obstetrics & Gynecology

## 2022-02-03 ENCOUNTER — Encounter: Payer: Self-pay | Admitting: Obstetrics & Gynecology

## 2022-02-03 VITALS — BP 111/69 | HR 72 | Wt 150.0 lb

## 2022-02-03 DIAGNOSIS — Z124 Encounter for screening for malignant neoplasm of cervix: Secondary | ICD-10-CM

## 2022-02-03 DIAGNOSIS — Z1379 Encounter for other screening for genetic and chromosomal anomalies: Secondary | ICD-10-CM

## 2022-02-03 DIAGNOSIS — Z3402 Encounter for supervision of normal first pregnancy, second trimester: Secondary | ICD-10-CM

## 2022-02-03 NOTE — Progress Notes (Signed)
? ?  LOW-RISK PREGNANCY VISIT ?Patient name: Kathleen Schmidt MRN [id number]  Date of birth: 1996-04-12 ?Chief Complaint:   ?Routine Prenatal Visit and Gynecologic Exam ? ?History of Present Illness:   ?Kathleen Schmidt is a 26 y.o. G92P0000 female at [redacted]w[redacted]d with an Estimated Date of Delivery: 07/15/22 being seen today for ongoing management of a low-risk pregnancy.  ?Depression screen [facility] 2/9 01/06/2022 04/28/2021  ?Decreased Interest 1 0  ?Down, Depressed, Hopeless 1 0  ?PHQ - 2 Score 2 0  ?Altered sleeping 0 0  ?Tired, decreased energy 2 0  ?Change in appetite 2 1  ?Feeling bad or failure about yourself  1 0  ?Trouble concentrating 0 0  ?Moving slowly or fidgety/restless 0 0  ?Suicidal thoughts 0 0  ?PHQ-9 Score 7 1  ? ? ?Today she reports no complaints. Contractions: Not present. Vag. Bleeding: None.  Movement: Absent. denies leaking of fluid. ?Review of Systems:   ?Pertinent items are noted in HPI ?Denies abnormal vaginal discharge w/ itching/odor/irritation, headaches, visual changes, shortness of breath, chest pain, abdominal pain, severe nausea/vomiting, or problems with urination or bowel movements unless otherwise stated above. ?Pertinent History Reviewed:  ?Reviewed past medical,surgical, social, obstetrical and family history.  ?Reviewed problem list, medications and allergies. ?Physical Assessment:  ? ?Vitals:  ? 02/03/22 0914  ?BP: 111/69  ?Pulse: 72  ?Weight: 150 lb (68 kg)  ?Body mass index is 26.57 kg/m?. ?  ?     Physical Examination:  ? General appearance: Well appearing, and in no distress ? Mental status: Alert, oriented to person, place, and time ? Skin: Warm & dry ? Cardiovascular: Normal heart rate noted ? Respiratory: Normal respiratory effort, no distress ? Abdomen: Soft, gravid, nontender ? Pelvic: Cervical exam deferred        ? Extremities: Edema: None ? ?Fetal Status:     Movement: Absent   ? ?Chaperone:  Marcelino Scot RN    ? ?No results found for this or any previous visit (from the past 24 hour(s)).   ?Assessment & Plan:  ?1) Low-risk pregnancy G1P0000 at [redacted]w[redacted]d with an Estimated Date of Delivery: 07/15/22  ? ? ?  ?Meds: No orders of the defined types were placed in this encounter. ? ?Labs/procedures today: 2nd IT ? ?Plan:  Continue routine obstetrical care  ?Next visit: prefers in person   ? ?Reviewed: Preterm labor symptoms and general obstetric precautions including but not limited to vaginal bleeding, contractions, leaking of fluid and fetal movement were reviewed in detail with the patient.  All questions were answered. Has home bp cuff. Rx faxed to . Check bp weekly, let us know if >140/90.  ? ?Follow-up: Return in about 3 weeks (around 02/24/2022) for 20 week sono, LROB. ? ?Orders Placed This Encounter  ?Procedures  ? US OB Comp + 14 Wk  ? INTEGRATED 2  ? ? ?Florian Buff, MD ?02/03/2022 ?9:45 AM ? ?

## 2022-02-06 LAB — INTEGRATED 2
AFP MoM: 1.45
Alpha-Fetoprotein: 50.1 ng/mL
Crown Rump Length: 59 mm
DIA MoM: 0.92
DIA Value: 145.5 pg/mL
Estriol, Unconjugated: 1.61 ng/mL
Gest. Age on Collection Date: 12.3 weeks
Gestational Age: 16.3 weeks
Maternal Age at EDD: 24.9 yr
Nuchal Translucency (NT): 1.4 mm
Nuchal Translucency MoM: 1.06
Number of Fetuses: 1
PAPP-A MoM: 0.97
PAPP-A Value: 867.4 ng/mL
Test Results:: NEGATIVE
Weight: 149 [lb_av]
Weight: 149 [lb_av]
hCG MoM: 1.57
hCG Value: 57.3 IU/mL
uE3 MoM: 1.59

## 2022-02-06 LAB — CYTOLOGY - PAP: Diagnosis: NEGATIVE

## 2022-02-16 ENCOUNTER — Encounter: Payer: Self-pay | Admitting: Women's Health

## 2022-02-27 ENCOUNTER — Other Ambulatory Visit: Payer: Self-pay | Admitting: Obstetrics & Gynecology

## 2022-02-27 DIAGNOSIS — Z3402 Encounter for supervision of normal first pregnancy, second trimester: Secondary | ICD-10-CM

## 2022-02-27 DIAGNOSIS — Z363 Encounter for antenatal screening for malformations: Secondary | ICD-10-CM

## 2022-02-28 ENCOUNTER — Ambulatory Visit (INDEPENDENT_AMBULATORY_CARE_PROVIDER_SITE_OTHER): Payer: Medicaid Other | Admitting: Women's Health

## 2022-02-28 ENCOUNTER — Other Ambulatory Visit: Payer: Self-pay

## 2022-02-28 ENCOUNTER — Ambulatory Visit (INDEPENDENT_AMBULATORY_CARE_PROVIDER_SITE_OTHER): Payer: Medicaid Other

## 2022-02-28 ENCOUNTER — Encounter: Payer: Self-pay | Admitting: Women's Health

## 2022-02-28 VITALS — BP 115/66 | HR 64 | Wt 153.0 lb

## 2022-02-28 DIAGNOSIS — Z3402 Encounter for supervision of normal first pregnancy, second trimester: Secondary | ICD-10-CM

## 2022-02-28 DIAGNOSIS — Z3A20 20 weeks gestation of pregnancy: Secondary | ICD-10-CM

## 2022-02-28 DIAGNOSIS — Z363 Encounter for antenatal screening for malformations: Secondary | ICD-10-CM | POA: Diagnosis not present

## 2022-02-28 NOTE — Patient Instructions (Signed)
Kathleen Schmidt, thank you for choosing our office today! We appreciate the opportunity to meet your healthcare needs. You may receive a short survey by mail, e-mail, or through [employer]. If you are happy with your care we would appreciate if you could take just a few minutes to complete the survey questions. We read all of your comments and take your feedback very seriously. Thank you again for choosing our office.  ?[facility] [employer] Team at [facility] ?[facility] at [facility] ?([address] [place], [place] [address]) ?Entrance C, located off of [address] ?Free 24/7 valet parking  ?Go to [email or web address] to register for FREE online childbirth classes ? ?Call the office [phone]) or go to [facility] if: ?You begin to severe cramping ?Your water breaks.  Sometimes it is a big gush of fluid, sometimes it is just a trickle that keeps getting your panties wet or running down your legs ?You have vaginal bleeding.  It is normal to have a small amount of spotting if your cervix was checked.  ? ?[place] Pediatricians/Family Doctors ?[facility]): [address], [phone]           ?[facility]: [address], [phone]                ?[facility]): [address], [phone] (call to ask if accepting patients) ?[facility] Department: [address], [place], [phone]   ? ?[place] Pediatricians/Family Doctors ?[facility]): [address], [phone] ?[facility]: [address] [place], [phone] ?[facility] [place]: [address], [phone] ? ?[employer] Family Doctors  ?[facility]): [phone] ?[facility]: [address], [phone]  ? ?[facility] Family Doctors ?[facility]: [address], [phone] ? ?[facility]  ?[facility]: [phone], [phone] ? ?Home Blood Pressure Monitoring for Patients  ? ?Your provider has recommended that you check your blood pressure (BP) at least once a week at home. If you do not have a blood pressure cuff at home, one will be provided for you. Contact your provider if you have not received your monitor within 1 week.  ? ?Helpful Tips for Accurate Home Blood Pressure Checks  ?Don't smoke, exercise, or drink caffeine 30 minutes before checking your BP ?Use the restroom before checking your BP (a full bladder can raise your pressure) ?Relax in a comfortable upright chair ?Feet on the ground ?Left arm resting comfortably on a flat surface at the level of your heart ?Legs uncrossed ?Back supported ?Sit quietly and don't talk ?Place the cuff on your bare arm ?Adjust snuggly, so that only two fingertips can fit between your skin and the top of the cuff ?Check 2 readings separated by at least one minute ?Keep a log of your BP readings ?For a visual, please reference this diagram: http://ccnc.care/bpdiagram ? ?Provider Name: [facility]     Phone: [phone] ? ?Zone 1: ALL CLEAR  ?Continue to monitor your symptoms:  ?BP reading is less than 140 (top number) or less than 90 (bottom number)  ?No right upper stomach pain ?No headaches or seeing spots ?No feeling nauseated or throwing up ?No swelling in face and hands ? ?Zone 2: CAUTION ?Call your doctor's office for any of the following:  ?BP reading is greater than 140 (top number) or greater than  90 (bottom number)  ?Stomach pain under your ribs in the middle or right side ?Headaches or seeing spots ?Feeling nauseated or throwing up ?Swelling in face and hands ? ?Zone 3: EMERGENCY  ?Seek immediate medical care if you have any of the following:  ?BP reading is greater than160 (top number) or greater than 110 (bottom number) ?Severe headaches not improving with Tylenol ?Serious difficulty catching your breath ?Any worsening symptoms from  Zone 2  ? ?  ?Second Trimester of Pregnancy ?The second trimester is from week 14 through week 27 (months 4 through 6). The second trimester is often a time when you feel your best. Your body has adjusted to being pregnant, and you begin to feel better physically. Usually, morning sickness has lessened or quit completely, you may have more energy, and you may have an increase in appetite. The second trimester is also a time when the fetus is growing rapidly. At the end of the sixth month, the fetus is about 9 inches long and weighs about 1? pounds. You will likely begin to feel the baby move (quickening) between 16 and 20 weeks of pregnancy. ?Body changes during your second trimester ?Your body continues to go through many changes during your second trimester. The changes vary from woman to woman. ?Your weight will continue to increase. You will notice your lower abdomen bulging out. ?You may begin to get stretch marks on your hips, abdomen, and breasts. ?You may develop headaches that can be relieved by medicines. The medicines should be approved by your health care provider. ?You may urinate more often because the fetus is pressing on your bladder. ?You may develop or continue to have heartburn as a result of your pregnancy. ?You may develop constipation because certain hormones are causing the muscles that push waste through your intestines to slow down. ?You may develop hemorrhoids or swollen, bulging veins (varicose veins). ?You may have back pain. This is caused by: ?Weight gain. ?Pregnancy hormones that are relaxing the joints in your pelvis. ?A shift in weight and the muscles that support your balance. ?Your breasts will continue to grow and they will continue to become tender. ?Your gums may bleed and may be sensitive to brushing and flossing. ?Dark spots or blotches (chloasma, mask of pregnancy) may develop on your face. This will likely fade after the baby is born. ?A dark line from your belly button to  the pubic area (linea nigra) may appear. This will likely fade after the baby is born. ?You may have changes in your hair. These can include thickening of your hair, rapid growth, and changes in texture. Some women also have hair loss during or after pregnancy, or hair that feels dry or thin. Your hair will most likely return to normal after your baby is born. ? ?What to expect at prenatal visits ?During a routine prenatal visit: ?You will be weighed to make sure you and the fetus are growing normally. ?Your blood pressure will be taken. ?Your abdomen will be measured to track your baby's growth. ?The fetal heartbeat will be listened to. ?Any test results from the previous visit will be discussed. ? ?Your health care provider may ask you: ?How you are feeling. ?If you are feeling the baby move. ?If you have had any abnormal symptoms, such as leaking fluid, bleeding, severe headaches, or abdominal cramping. ?If you are using any tobacco products, including cigarettes, chewing tobacco, and electronic cigarettes. ?If you have any questions. ? ?Other tests that may be performed during  your second trimester include: ?Blood tests that check for: ?Low iron levels (anemia). ?High blood sugar that affects pregnant women (gestational diabetes) between 59 and 28 weeks. ?Rh antibodies. This is to check for a protein on red blood cells (Rh factor). ?Urine tests to check for infections, diabetes, or protein in the urine. ?An ultrasound to confirm the proper growth and development of the baby. ?An amniocentesis to check for possible genetic problems. ?Fetal screens for spina bifida and Down syndrome. ?HIV (human immunodeficiency virus) testing. Routine prenatal testing includes screening for HIV, unless you choose not to have this test. ? ?Follow these instructions at home: ?Medicines ?Follow your health care provider's instructions regarding medicine use. Specific medicines may be either safe or unsafe to take during  pregnancy. ?Take a prenatal vitamin that contains at least 600 micrograms (mcg) of folic acid. ?If you develop constipation, try taking a stool softener if your health care provider approves. ?Eating and drinking ?Eat

## 2022-02-28 NOTE — Progress Notes (Signed)
[place] 19+6 wks,cephalic,posterior placenta gr 0,normal ovaries,FHR 169 bpm,SVP of fluid 4.3 cm,EFW 357 g 48%,RVEICF 2 mm,anatomy complete ?

## 2022-02-28 NOTE — Progress Notes (Signed)
? ? ?LOW-RISK PREGNANCY VISIT ?Patient name: Kathleen Schmidt MRN [id number]  Date of birth: March 26, 1996 ?Chief Complaint:   ?Routine Prenatal Visit ? ?History of Present Illness:   ?Kathleen Schmidt is a 26 y.o. G73P0000 female at [redacted]w[redacted]d with an Estimated Date of Delivery: 07/15/22 being seen today for ongoing management of a low-risk pregnancy.  ? ?Today she reports no complaints. Contractions: Not present. Vag. Bleeding: None.  Movement: Present. denies leaking of fluid. ? ? ?  01/06/2022  ?  9:29 AM 04/28/2021  ? 11:58 AM  ?Depression screen PHQ 2/9  ?Decreased Interest 1 0  ?Down, Depressed, Hopeless 1 0  ?PHQ - 2 Score 2 0  ?Altered sleeping 0 0  ?Tired, decreased energy 2 0  ?Change in appetite 2 1  ?Feeling bad or failure about yourself  1 0  ?Trouble concentrating 0 0  ?Moving slowly or fidgety/restless 0 0  ?Suicidal thoughts 0 0  ?PHQ-9 Score 7 1  ? ?  ? ?  04/28/2021  ? 11:59 AM  ?GAD 7 : Generalized Anxiety Score  ?Nervous, Anxious, on Edge 0  ?Control/stop worrying 0  ?Worry too much - different things 0  ?Trouble relaxing 0  ?Restless 0  ?Easily annoyed or irritable 0  ?Afraid - awful might happen 0  ?Total GAD 7 Score 0  ? ? ?  ?Review of Systems:   ?Pertinent items are noted in HPI ?Denies abnormal vaginal discharge w/ itching/odor/irritation, headaches, visual changes, shortness of breath, chest pain, abdominal pain, severe nausea/vomiting, or problems with urination or bowel movements unless otherwise stated above. ?Pertinent History Reviewed:  ?Reviewed past medical,surgical, social, obstetrical and family history.  ?Reviewed problem list, medications and allergies. ?Physical Assessment:  ? ?Vitals:  ? 02/28/22 1549  ?BP: 115/66  ?Pulse: 64  ?Weight: 153 lb (69.4 kg)  ?Body mass index is 27.1 kg/m?. ?  ?     Physical Examination:  ? General appearance: Well appearing, and in no distress ? Mental status: Alert, oriented to person, place, and time ? Skin: Warm & dry ? Cardiovascular: Normal heart rate  noted ? Respiratory: Normal respiratory effort, no distress ? Abdomen: Soft, gravid, nontender ? Pelvic: Cervical exam deferred        ? Extremities: Edema: None ? ?Fetal Status: Fetal Heart Rate (bpm): 169 u/s   Movement: Present  [place] 19+6 wks,cephalic,posterior placenta gr 0,normal ovaries,FHR 169 bpm,SVP of fluid 4.3 cm,EFW 357 g 48%,RVEICF 2 mm,anatomy complete ? ?Chaperone: N/A   ?No results found for this or any previous visit (from the past 24 hour(s)).  ?Assessment & Plan:  ?1) Low-risk pregnancy G1P0000 at [redacted]w[redacted]d with an Estimated Date of Delivery: 07/15/22  ? ?2) Fetal isolated RVEICF, discussed and gave printed info, Panorama LR, no further testing needed ?  ?Meds: No orders of the defined types were placed in this encounter. ? ?Labs/procedures today: U/S ? ?Plan:  Continue routine obstetrical care  ?Next visit: prefers in person   ? ?Reviewed: Preterm labor symptoms and general obstetric precautions including but not limited to vaginal bleeding, contractions, leaking of fluid and fetal movement were reviewed in detail with the patient.  All questions were answered. Does have home bp cuff. Office bp cuff given: not applicable. Check bp weekly, let us know if consistently >140 and/or >90. ? ?Follow-up: Return in about 4 weeks (around 03/28/2022) for LROB, CNM, in person. ? ?No future appointments. ? ?No orders of the defined types were placed in this encounter. ? ?Cheral Marker CNM,  WHNP-BC ?02/28/2022 ?4:03 PM  ?

## 2022-03-28 ENCOUNTER — Ambulatory Visit (INDEPENDENT_AMBULATORY_CARE_PROVIDER_SITE_OTHER): Payer: Medicaid Other | Admitting: Obstetrics & Gynecology

## 2022-03-28 ENCOUNTER — Encounter: Payer: Self-pay | Admitting: Obstetrics & Gynecology

## 2022-03-28 VITALS — BP 108/66 | HR 60 | Wt 162.0 lb

## 2022-03-28 DIAGNOSIS — Z3402 Encounter for supervision of normal first pregnancy, second trimester: Secondary | ICD-10-CM

## 2022-03-28 NOTE — Progress Notes (Signed)
? ?  LOW-RISK PREGNANCY VISIT ?Patient name: Kathleen Schmidt MRN [id number]  Date of birth: 1996/05/28 ?Chief Complaint:   ?Routine Prenatal Visit ? ?History of Present Illness:   ?Kathleen Schmidt is a 26 y.o. G60P0000 female at [redacted]w[redacted]d with an Estimated Date of Delivery: 07/15/22 being seen today for ongoing management of a low-risk pregnancy.  ? ?  01/06/2022  ?  9:29 AM 04/28/2021  ? 11:58 AM  ?Depression screen PHQ 2/9  ?Decreased Interest 1 0  ?Down, Depressed, Hopeless 1 0  ?PHQ - 2 Score 2 0  ?Altered sleeping 0 0  ?Tired, decreased energy 2 0  ?Change in appetite 2 1  ?Feeling bad or failure about yourself  1 0  ?Trouble concentrating 0 0  ?Moving slowly or fidgety/restless 0 0  ?Suicidal thoughts 0 0  ?PHQ-9 Score 7 1  ? ? ?Today she reports no complaints. Contractions: Not present. Vag. Bleeding: None.  Movement: Present. denies leaking of fluid. ?Review of Systems:   ?Pertinent items are noted in HPI ?Denies abnormal vaginal discharge w/ itching/odor/irritation, headaches, visual changes, shortness of breath, chest pain, abdominal pain, severe nausea/vomiting, or problems with urination or bowel movements unless otherwise stated above. ?Pertinent History Reviewed:  ?Reviewed past medical,surgical, social, obstetrical and family history.  ?Reviewed problem list, medications and allergies. ?Physical Assessment:  ? ?Vitals:  ? 03/28/22 1135  ?BP: 108/66  ?Pulse: 60  ?Weight: 162 lb (73.5 kg)  ?Body mass index is 28.7 kg/m?. ?  ?     Physical Examination:  ? General appearance: Well appearing, and in no distress ? Mental status: Alert, oriented to person, place, and time ? Skin: Warm & dry ? Cardiovascular: Normal heart rate noted ? Respiratory: Normal respiratory effort, no distress ? Abdomen: Soft, gravid, nontender ? Pelvic: Cervical exam deferred        ? Extremities: Edema: None ? ?Fetal Status:     Movement: Present   ? ?Chaperone: n/a   ? ?No results found for this or any previous visit (from the past 24 hour(s)).   ?Assessment & Plan:  ?1) Low-risk pregnancy G1P0000 at [redacted]w[redacted]d with an Estimated Date of Delivery: 07/15/22  ? ? ?  ?Meds: No orders of the defined types were placed in this encounter. ? ?Labs/procedures today:  ? ?Plan:  Continue routine obstetrical care  ?Next visit: prefers in person   ? ? ?Follow-up: Return in about 4 weeks (around 04/25/2022) for PN2, LROB. ? ?No orders of the defined types were placed in this encounter. ? ? ?Lazaro Arms, MD ?03/28/2022 ?12:09 PM ? ?

## 2022-04-24 ENCOUNTER — Encounter: Payer: Self-pay | Admitting: Women's Health

## 2022-04-25 ENCOUNTER — Encounter: Payer: Self-pay | Admitting: Women's Health

## 2022-04-25 ENCOUNTER — Other Ambulatory Visit: Payer: Medicaid Other

## 2022-04-25 ENCOUNTER — Ambulatory Visit (INDEPENDENT_AMBULATORY_CARE_PROVIDER_SITE_OTHER): Payer: Medicaid Other | Admitting: Women's Health

## 2022-04-25 VITALS — BP 123/74 | HR 77 | Wt 159.0 lb

## 2022-04-25 DIAGNOSIS — Z3A28 28 weeks gestation of pregnancy: Secondary | ICD-10-CM

## 2022-04-25 DIAGNOSIS — Z3403 Encounter for supervision of normal first pregnancy, third trimester: Secondary | ICD-10-CM

## 2022-04-25 DIAGNOSIS — Z131 Encounter for screening for diabetes mellitus: Secondary | ICD-10-CM

## 2022-04-25 NOTE — Progress Notes (Addendum)
    LOW-RISK PREGNANCY VISIT Patient name: Kathleen Schmidt MRN [id number]  Date of birth: 04-10-1996 Chief Complaint:   Routine Prenatal Visit  History of Present Illness:   Kathleen Schmidt is a 26 y.o. G72P0000 female at [redacted]w[redacted]d with an Estimated Date of Delivery: 07/15/22 being seen today for ongoing management of a low-risk pregnancy.   Today she reports no complaints. Contractions: Not present. Vag. Bleeding: None.  Movement: Present. denies leaking of fluid.     01/06/2022    9:29 AM 04/28/2021   11:58 AM  Depression screen PHQ 2/9  Decreased Interest 1 0  Down, Depressed, Hopeless 1 0  PHQ - 2 Score 2 0  Altered sleeping 0 0  Tired, decreased energy 2 0  Change in appetite 2 1  Feeling bad or failure about yourself  1 0  Trouble concentrating 0 0  Moving slowly or fidgety/restless 0 0  Suicidal thoughts 0 0  PHQ-9 Score 7 1        04/28/2021   11:59 AM  GAD 7 : Generalized Anxiety Score  Nervous, Anxious, on Edge 0  Control/stop worrying 0  Worry too much - different things 0  Trouble relaxing 0  Restless 0  Easily annoyed or irritable 0  Afraid - awful might happen 0  Total GAD 7 Score 0      Review of Systems:   Pertinent items are noted in HPI Denies abnormal vaginal discharge w/ itching/odor/irritation, headaches, visual changes, shortness of breath, chest pain, abdominal pain, severe nausea/vomiting, or problems with urination or bowel movements unless otherwise stated above. Pertinent History Reviewed:  Reviewed past medical,surgical, social, obstetrical and family history.  Reviewed problem list, medications and allergies. Physical Assessment:   Vitals:   04/25/22 0859  BP: 123/74  Pulse: 77  Weight: 159 lb (72.1 kg)  Body mass index is 28.17 kg/m.        Physical Examination:   General appearance: Well appearing, and in no distress  Mental status: Alert, oriented to person, place, and time  Skin: Warm & dry  Cardiovascular: Normal heart rate  noted  Respiratory: Normal respiratory effort, no distress  Abdomen: Soft, gravid, nontender  Pelvic: Cervical exam deferred         Extremities: Edema: None  Fetal Status: Fetal Heart Rate (bpm): 159 Fundal Height: 26 cm Movement: Present    Chaperone: N/A   No results found for this or any previous visit (from the past 24 hour(s)).  Assessment & Plan:  1) Low-risk pregnancy G1P0000 at [redacted]w[redacted]d with an Estimated Date of Delivery: 07/15/22    Meds: No orders of the defined types were placed in this encounter.  Labs/procedures today: PN2, wants tdap next visit  Plan:  Continue routine obstetrical care  Next visit: prefers in person    Reviewed: Preterm labor symptoms and general obstetric precautions including but not limited to vaginal bleeding, contractions, leaking of fluid and fetal movement were reviewed in detail with the patient.  All questions were answered. Does have home bp cuff. Office bp cuff given: not applicable. Check bp weekly, let us know if consistently >140 and/or >90.  Follow-up: Return in about 2 weeks (around 05/09/2022) for LROB, CNM, in person.  No future appointments.  No orders of the defined types were placed in this encounter.  Cheral Marker CNM, [facility] 04/25/2022 9:31 AM

## 2022-04-25 NOTE — Patient Instructions (Signed)
Kathleen Schmidt, thank you for choosing our office today! We appreciate the opportunity to meet your healthcare needs. You may receive a short survey by mail, e-mail, or through [employer]. If you are happy with your care we would appreciate if you could take just a few minutes to complete the survey questions. We read all of your comments and take your feedback very seriously. Thank you again for choosing our office.  Center for [employer] Team at [facility]  [facility] at [facility] ([address] [place], [place] [address]) Entrance C, located off of [address] Free 24/7 valet parking   CLASSES: Go to [email or web address] to register for classes (childbirth, breastfeeding, waterbirth, infant CPR, daddy bootcamp, etc.)  Call the office [phone]) or go to [facility] if: You begin to have strong, frequent contractions Your water breaks.  Sometimes it is a big gush of fluid, sometimes it is just a trickle that keeps getting your panties wet or running down your legs You have vaginal bleeding.  It is normal to have a small amount of spotting if your cervix was checked.  You don't feel your baby moving like normal.  If you don't, get you something to eat and drink and lay down and focus on feeling your baby move.   If your baby is still not moving like normal, you should call the office or go to [facility]  Call the office [phone]) or go to [facility] hospital for these signs of pre-eclampsia: Severe headache that does not go away with Tylenol Visual changes- seeing spots, double, blurred vision Pain under your right breast or upper abdomen that does not go away with Tums or heartburn medicine Nausea and/or vomiting Severe swelling in your hands, feet, and face   Tdap Vaccine It is recommended that you get the Tdap vaccine during the third trimester of EACH pregnancy to help protect your baby from getting pertussis (whooping cough) 27-36 weeks is the BEST time to do  this so that you can pass the protection on to your baby. During pregnancy is better than after pregnancy, but if you are unable to get it during pregnancy it will be offered at the hospital.  You can get this vaccine with [place], at the health department, your family doctor, or some local pharmacies Everyone who will be around your baby should also be up-to-date on their vaccines before the baby comes. Adults (who are not pregnant) only need 1 dose of Tdap during adulthood.   [facility] Pediatricians/Family Doctors [facility]): [address] Colette Ribas, [phone]           [facility]: [address], [phone]                [facility]): [address], [phone] (call to ask if accepting patients) [facility] [address] [facility] Department: [address], [place], [phone]    [facility] Pediatricians/Family Doctors [facility]): [phone], [phone] [facility]: [address] [place], [phone] [facility]: [address], [phone]  [facility] Doctors  [facility]): [phone] [facility]: [address], [phone]   [facility] Doctors [facility]: [address], [phone]  [facility] Family Doctors  [facility]: [phone], [phone]  Home Blood Pressure Monitoring for Patients   Your provider has recommended that you check your  blood pressure (BP) at least once a week at home. If you do not have a blood pressure cuff at home, one will be provided for you. Contact your provider if you have not received your monitor within 1 week.   Helpful Tips for Accurate Home Blood Pressure Checks  Don't smoke, exercise, or drink caffeine 30 minutes before checking your BP Use the restroom before checking your BP (a full bladder can raise your  pressure) Relax in a comfortable upright chair Feet on the ground Left arm resting comfortably on a flat surface at the level of your heart Legs uncrossed Back supported Sit quietly and don't talk Place the cuff on your bare arm Adjust snuggly, so that only two fingertips can fit between your skin and the top of the cuff Check 2 readings separated by at least one minute Keep a log of your BP readings For a visual, please reference this diagram: http://ccnc.care/bpdiagram  Provider Name: [facility]     Phone: [phone]  Zone 1: ALL CLEAR  Continue to monitor your symptoms:  BP reading is less than 140 (top number) or less than 90 (bottom number)  No right upper stomach pain No headaches or seeing spots No feeling nauseated or throwing up No swelling in face and hands  Zone 2: CAUTION Call your doctor's office for any of the following:  BP reading is greater than 140 (top number) or greater than 90 (bottom number)  Stomach pain under your ribs in the middle or right side Headaches or seeing spots Feeling nauseated or throwing up Swelling in face and hands  Zone 3: EMERGENCY  Seek immediate medical care if you have any of the following:  BP reading is greater than160 (top number) or greater than 110 (bottom number) Severe headaches not improving with Tylenol Serious difficulty catching your breath Any worsening symptoms from Zone 2   Third Trimester of Pregnancy The third trimester is from week 29 through week 42, months 7 through 9. The third trimester is a time when the fetus is growing rapidly. At the end of the ninth month, the fetus is about 20 inches in length and weighs 6-10 pounds.  BODY CHANGES Your body goes through many changes during pregnancy. The changes vary from woman to woman.  Your weight will continue to increase. You can expect to gain 25-35 pounds (11-16 kg) by the end of the pregnancy. You may begin to get stretch marks on your hips, abdomen,  and breasts. You may urinate more often because the fetus is moving lower into your pelvis and pressing on your bladder. You may develop or continue to have heartburn as a result of your pregnancy. You may develop constipation because certain hormones are causing the muscles that push waste through your intestines to slow down. You may develop hemorrhoids or swollen, bulging veins (varicose veins). You may have pelvic pain because of the weight gain and pregnancy hormones relaxing your joints between the bones in your pelvis. Backaches may result from overexertion of the muscles supporting your posture. You may have changes in your hair. These can include thickening of your hair, rapid growth, and changes in texture. Some women also have hair loss during or after pregnancy, or hair that feels dry or thin. Your hair will most likely return to normal after your baby is born. Your breasts will continue to grow and be tender. A yellow discharge may leak from your breasts called colostrum. Your belly button may stick out. You may   feel short of breath because of your expanding uterus. You may notice the fetus "dropping," or moving lower in your abdomen. You may have a bloody mucus discharge. This usually occurs a few days to a week before labor begins. Your cervix becomes thin and soft (effaced) near your due date. WHAT TO EXPECT AT YOUR PRENATAL EXAMS  You will have prenatal exams every 2 weeks until week 36. Then, you will have weekly prenatal exams. During a routine prenatal visit: You will be weighed to make sure you and the fetus are growing normally. Your blood pressure is taken. Your abdomen will be measured to track your baby's growth. The fetal heartbeat will be listened to. Any test results from the previous visit will be discussed. You may have a cervical check near your due date to see if you have effaced. At around 36 weeks, your caregiver will check your cervix. At the same time, your  caregiver will also perform a test on the secretions of the vaginal tissue. This test is to determine if a type of bacteria, Group B streptococcus, is present. Your caregiver will explain this further. Your caregiver may ask you: What your birth plan is. How you are feeling. If you are feeling the baby move. If you have had any abnormal symptoms, such as leaking fluid, bleeding, severe headaches, or abdominal cramping. If you have any questions. Other tests or screenings that may be performed during your third trimester include: Blood tests that check for low iron levels (anemia). Fetal testing to check the health, activity level, and growth of the fetus. Testing is done if you have certain medical conditions or if there are problems during the pregnancy. FALSE LABOR You may feel small, irregular contractions that eventually go away. These are called Braxton Hicks contractions, or false labor. Contractions may last for hours, days, or even weeks before true labor sets in. If contractions come at regular intervals, intensify, or become painful, it is best to be seen by your caregiver.  SIGNS OF LABOR  Menstrual-like cramps. Contractions that are 5 minutes apart or less. Contractions that start on the top of the uterus and spread down to the lower abdomen and back. A sense of increased pelvic pressure or back pain. A watery or bloody mucus discharge that comes from the vagina. If you have any of these signs before the 37th week of pregnancy, call your caregiver right away. You need to go to the hospital to get checked immediately. HOME CARE INSTRUCTIONS  Avoid all smoking, herbs, alcohol, and unprescribed drugs. These chemicals affect the formation and growth of the baby. Follow your caregiver's instructions regarding medicine use. There are medicines that are either safe or unsafe to take during pregnancy. Exercise only as directed by your caregiver. Experiencing uterine cramps is a good sign to  stop exercising. Continue to eat regular, healthy meals. Wear a good support bra for breast tenderness. Do not use hot tubs, steam rooms, or saunas. Wear your seat belt at all times when driving. Avoid raw meat, uncooked cheese, cat litter boxes, and soil used by cats. These carry germs that can cause birth defects in the baby. Take your prenatal vitamins. Try taking a stool softener (if your caregiver approves) if you develop constipation. Eat more high-fiber foods, such as fresh vegetables or fruit and whole grains. Drink plenty of fluids to keep your urine clear or pale yellow. Take warm sitz baths to soothe any pain or discomfort caused by hemorrhoids. Use hemorrhoid cream if   your caregiver approves. If you develop varicose veins, wear support hose. Elevate your feet for 15 minutes, 3-4 times a day. Limit salt in your diet. Avoid heavy lifting, wear low heal shoes, and practice good posture. Rest a lot with your legs elevated if you have leg cramps or low back pain. Visit your dentist if you have not gone during your pregnancy. Use a soft toothbrush to brush your teeth and be gentle when you floss. A sexual relationship may be continued unless your caregiver directs you otherwise. Do not travel far distances unless it is absolutely necessary and only with the approval of your caregiver. Take prenatal classes to understand, practice, and ask questions about the labor and delivery. Make a trial run to the hospital. Pack your hospital bag. Prepare the baby's nursery. Continue to go to all your prenatal visits as directed by your caregiver. SEEK MEDICAL CARE IF: You are unsure if you are in labor or if your water has broken. You have dizziness. You have mild pelvic cramps, pelvic pressure, or nagging pain in your abdominal area. You have persistent nausea, vomiting, or diarrhea. You have a bad smelling vaginal discharge. You have pain with urination. SEEK IMMEDIATE MEDICAL CARE IF:  You  have a fever. You are leaking fluid from your vagina. You have spotting or bleeding from your vagina. You have severe abdominal cramping or pain. You have rapid weight loss or gain. You have shortness of breath with chest pain. You notice sudden or extreme swelling of your face, hands, ankles, feet, or legs. You have not felt your baby move in over an hour. You have severe headaches that do not go away with medicine. You have vision changes. Document Released: 11/14/2001 Document Revised: 11/25/2013 Document Reviewed: 01/21/2013 ExitCare Patient Information 2015 [employer]. This information is not intended to replace advice given to you by your health care provider. Make sure you discuss any questions you have with your health care provider.       

## 2022-04-26 ENCOUNTER — Encounter: Payer: Medicaid Other | Admitting: Advanced Practice Midwife

## 2022-04-26 LAB — ANTIBODY SCREEN: Antibody Screen: NEGATIVE

## 2022-04-26 LAB — CBC
Hematocrit: 36.4 % (ref 34.0–46.6)
Hemoglobin: 12.3 g/dL (ref 11.1–15.9)
MCH: 30.6 pg (ref 26.6–33.0)
MCHC: 33.8 g/dL (ref 31.5–35.7)
MCV: 91 fL (ref 79–97)
Platelets: 344 10*3/uL (ref 150–450)
RBC: 4.02 x10E6/uL (ref 3.77–5.28)
RDW: 12.1 % (ref 11.7–15.4)
WBC: 6.4 10*3/uL (ref 3.4–10.8)

## 2022-04-26 LAB — GLUCOSE TOLERANCE, 2 HOURS W/ 1HR
Glucose, 1 hour: 113 mg/dL (ref 70–179)
Glucose, 2 hour: 95 mg/dL (ref 70–152)
Glucose, Fasting: 75 mg/dL (ref 70–91)

## 2022-04-26 LAB — RPR: RPR Ser Ql: NONREACTIVE

## 2022-04-26 LAB — HIV ANTIBODY (ROUTINE TESTING W REFLEX): HIV Screen 4th Generation wRfx: NONREACTIVE

## 2022-05-09 ENCOUNTER — Encounter: Payer: Self-pay | Admitting: Women's Health

## 2022-05-09 ENCOUNTER — Ambulatory Visit (INDEPENDENT_AMBULATORY_CARE_PROVIDER_SITE_OTHER): Payer: Medicaid Other | Admitting: Women's Health

## 2022-05-09 VITALS — BP 111/64 | HR 72 | Wt 163.0 lb

## 2022-05-09 DIAGNOSIS — Z3403 Encounter for supervision of normal first pregnancy, third trimester: Secondary | ICD-10-CM

## 2022-05-09 DIAGNOSIS — Z23 Encounter for immunization: Secondary | ICD-10-CM | POA: Diagnosis not present

## 2022-05-09 DIAGNOSIS — Z3A30 30 weeks gestation of pregnancy: Secondary | ICD-10-CM

## 2022-05-09 NOTE — Patient Instructions (Signed)
Kathleen Schmidt, thank you for choosing our office today! We appreciate the opportunity to meet your healthcare needs. You may receive a short survey by mail, e-mail, or through [employer]. If you are happy with your care we would appreciate if you could take just a few minutes to complete the survey questions. We read all of your comments and take your feedback very seriously. Thank you again for choosing our office.  Center for [employer] Team at [facility]  [facility] at [facility] ([address] [place], [place] [address]) Entrance C, located off of [address] Free 24/7 valet parking   CLASSES: Go to [email or web address] to register for classes (childbirth, breastfeeding, waterbirth, infant CPR, daddy bootcamp, etc.)  Call the office [phone]) or go to [facility] if: You begin to have strong, frequent contractions Your water breaks.  Sometimes it is a big gush of fluid, sometimes it is just a trickle that keeps getting your panties wet or running down your legs You have vaginal bleeding.  It is normal to have a small amount of spotting if your cervix was checked.  You don't feel your baby moving like normal.  If you don't, get you something to eat and drink and lay down and focus on feeling your baby move.   If your baby is still not moving like normal, you should call the office or go to [facility]  Call the office [phone]) or go to [facility] hospital for these signs of pre-eclampsia: Severe headache that does not go away with Tylenol Visual changes- seeing spots, double, blurred vision Pain under your right breast or upper abdomen that does not go away with Tums or heartburn medicine Nausea and/or vomiting Severe swelling in your hands, feet, and face   Tdap Vaccine It is recommended that you get the Tdap vaccine during the third trimester of EACH pregnancy to help protect your baby from getting pertussis (whooping cough) 27-36 weeks is the BEST time to do  this so that you can pass the protection on to your baby. During pregnancy is better than after pregnancy, but if you are unable to get it during pregnancy it will be offered at the hospital.  You can get this vaccine with [place], at the health department, your family doctor, or some local pharmacies Everyone who will be around your baby should also be up-to-date on their vaccines before the baby comes. Adults (who are not pregnant) only need 1 dose of Tdap during adulthood.   [facility] Pediatricians/Family Doctors [facility]): [address] Colette Ribas, [phone]           [facility]: [address], [phone]                [facility]): [address], [phone] (call to ask if accepting patients) [facility] [address] [facility] Department: [address], [place], [phone]    [facility] Pediatricians/Family Doctors [facility]): [phone], [phone] [facility]: [address] [place], [phone] [facility]: [address], [phone]  [facility] Doctors  [facility]): [phone] [facility]: [address], [phone]   [facility] Doctors [facility]: [address], [phone]  [facility] Family Doctors  [facility]: [phone], [phone]  Home Blood Pressure Monitoring for Patients   Your provider has recommended that you check your  blood pressure (BP) at least once a week at home. If you do not have a blood pressure cuff at home, one will be provided for you. Contact your provider if you have not received your monitor within 1 week.   Helpful Tips for Accurate Home Blood Pressure Checks  Don't smoke, exercise, or drink caffeine 30 minutes before checking your BP Use the restroom before checking your BP (a full bladder can raise your  pressure) Relax in a comfortable upright chair Feet on the ground Left arm resting comfortably on a flat surface at the level of your heart Legs uncrossed Back supported Sit quietly and don't talk Place the cuff on your bare arm Adjust snuggly, so that only two fingertips can fit between your skin and the top of the cuff Check 2 readings separated by at least one minute Keep a log of your BP readings For a visual, please reference this diagram: http://ccnc.care/bpdiagram  Provider Name: [facility]     Phone: [phone]  Zone 1: ALL CLEAR  Continue to monitor your symptoms:  BP reading is less than 140 (top number) or less than 90 (bottom number)  No right upper stomach pain No headaches or seeing spots No feeling nauseated or throwing up No swelling in face and hands  Zone 2: CAUTION Call your doctor's office for any of the following:  BP reading is greater than 140 (top number) or greater than 90 (bottom number)  Stomach pain under your ribs in the middle or right side Headaches or seeing spots Feeling nauseated or throwing up Swelling in face and hands  Zone 3: EMERGENCY  Seek immediate medical care if you have any of the following:  BP reading is greater than160 (top number) or greater than 110 (bottom number) Severe headaches not improving with Tylenol Serious difficulty catching your breath Any worsening symptoms from Zone 2   Third Trimester of Pregnancy The third trimester is from week 29 through week 42, months 7 through 9. The third trimester is a time when the fetus is growing rapidly. At the end of the ninth month, the fetus is about 20 inches in length and weighs 6-10 pounds.  BODY CHANGES Your body goes through many changes during pregnancy. The changes vary from woman to woman.  Your weight will continue to increase. You can expect to gain 25-35 pounds (11-16 kg) by the end of the pregnancy. You may begin to get stretch marks on your hips, abdomen,  and breasts. You may urinate more often because the fetus is moving lower into your pelvis and pressing on your bladder. You may develop or continue to have heartburn as a result of your pregnancy. You may develop constipation because certain hormones are causing the muscles that push waste through your intestines to slow down. You may develop hemorrhoids or swollen, bulging veins (varicose veins). You may have pelvic pain because of the weight gain and pregnancy hormones relaxing your joints between the bones in your pelvis. Backaches may result from overexertion of the muscles supporting your posture. You may have changes in your hair. These can include thickening of your hair, rapid growth, and changes in texture. Some women also have hair loss during or after pregnancy, or hair that feels dry or thin. Your hair will most likely return to normal after your baby is born. Your breasts will continue to grow and be tender. A yellow discharge may leak from your breasts called colostrum. Your belly button may stick out. You may   feel short of breath because of your expanding uterus. You may notice the fetus "dropping," or moving lower in your abdomen. You may have a bloody mucus discharge. This usually occurs a few days to a week before labor begins. Your cervix becomes thin and soft (effaced) near your due date. WHAT TO EXPECT AT YOUR PRENATAL EXAMS  You will have prenatal exams every 2 weeks until week 36. Then, you will have weekly prenatal exams. During a routine prenatal visit: You will be weighed to make sure you and the fetus are growing normally. Your blood pressure is taken. Your abdomen will be measured to track your baby's growth. The fetal heartbeat will be listened to. Any test results from the previous visit will be discussed. You may have a cervical check near your due date to see if you have effaced. At around 36 weeks, your caregiver will check your cervix. At the same time, your  caregiver will also perform a test on the secretions of the vaginal tissue. This test is to determine if a type of bacteria, Group B streptococcus, is present. Your caregiver will explain this further. Your caregiver may ask you: What your birth plan is. How you are feeling. If you are feeling the baby move. If you have had any abnormal symptoms, such as leaking fluid, bleeding, severe headaches, or abdominal cramping. If you have any questions. Other tests or screenings that may be performed during your third trimester include: Blood tests that check for low iron levels (anemia). Fetal testing to check the health, activity level, and growth of the fetus. Testing is done if you have certain medical conditions or if there are problems during the pregnancy. FALSE LABOR You may feel small, irregular contractions that eventually go away. These are called Braxton Hicks contractions, or false labor. Contractions may last for hours, days, or even weeks before true labor sets in. If contractions come at regular intervals, intensify, or become painful, it is best to be seen by your caregiver.  SIGNS OF LABOR  Menstrual-like cramps. Contractions that are 5 minutes apart or less. Contractions that start on the top of the uterus and spread down to the lower abdomen and back. A sense of increased pelvic pressure or back pain. A watery or bloody mucus discharge that comes from the vagina. If you have any of these signs before the 37th week of pregnancy, call your caregiver right away. You need to go to the hospital to get checked immediately. HOME CARE INSTRUCTIONS  Avoid all smoking, herbs, alcohol, and unprescribed drugs. These chemicals affect the formation and growth of the baby. Follow your caregiver's instructions regarding medicine use. There are medicines that are either safe or unsafe to take during pregnancy. Exercise only as directed by your caregiver. Experiencing uterine cramps is a good sign to  stop exercising. Continue to eat regular, healthy meals. Wear a good support bra for breast tenderness. Do not use hot tubs, steam rooms, or saunas. Wear your seat belt at all times when driving. Avoid raw meat, uncooked cheese, cat litter boxes, and soil used by cats. These carry germs that can cause birth defects in the baby. Take your prenatal vitamins. Try taking a stool softener (if your caregiver approves) if you develop constipation. Eat more high-fiber foods, such as fresh vegetables or fruit and whole grains. Drink plenty of fluids to keep your urine clear or pale yellow. Take warm sitz baths to soothe any pain or discomfort caused by hemorrhoids. Use hemorrhoid cream if   your caregiver approves. If you develop varicose veins, wear support hose. Elevate your feet for 15 minutes, 3-4 times a day. Limit salt in your diet. Avoid heavy lifting, wear low heal shoes, and practice good posture. Rest a lot with your legs elevated if you have leg cramps or low back pain. Visit your dentist if you have not gone during your pregnancy. Use a soft toothbrush to brush your teeth and be gentle when you floss. A sexual relationship may be continued unless your caregiver directs you otherwise. Do not travel far distances unless it is absolutely necessary and only with the approval of your caregiver. Take prenatal classes to understand, practice, and ask questions about the labor and delivery. Make a trial run to the hospital. Pack your hospital bag. Prepare the baby's nursery. Continue to go to all your prenatal visits as directed by your caregiver. SEEK MEDICAL CARE IF: You are unsure if you are in labor or if your water has broken. You have dizziness. You have mild pelvic cramps, pelvic pressure, or nagging pain in your abdominal area. You have persistent nausea, vomiting, or diarrhea. You have a bad smelling vaginal discharge. You have pain with urination. SEEK IMMEDIATE MEDICAL CARE IF:  You  have a fever. You are leaking fluid from your vagina. You have spotting or bleeding from your vagina. You have severe abdominal cramping or pain. You have rapid weight loss or gain. You have shortness of breath with chest pain. You notice sudden or extreme swelling of your face, hands, ankles, feet, or legs. You have not felt your baby move in over an hour. You have severe headaches that do not go away with medicine. You have vision changes. Document Released: 11/14/2001 Document Revised: 11/25/2013 Document Reviewed: 01/21/2013 ExitCare Patient Information 2015 [employer]. This information is not intended to replace advice given to you by your health care provider. Make sure you discuss any questions you have with your health care provider.       

## 2022-05-09 NOTE — Progress Notes (Signed)
    LOW-RISK PREGNANCY VISIT Patient name: Kathleen Schmidt MRN [id number]  Date of birth: 1996/06/16 Chief Complaint:   Routine Prenatal Visit  History of Present Illness:   Kathleen Schmidt is a 26 y.o. G29P0000 female at [redacted]w[redacted]d with an Estimated Date of Delivery: 07/15/22 being seen today for ongoing management of a low-risk pregnancy.   Today she reports no complaints. Contractions: Not present. Vag. Bleeding: None.  Movement: Present. denies leaking of fluid.     01/06/2022    9:29 AM 04/28/2021   11:58 AM  Depression screen PHQ 2/9  Decreased Interest 1 0  Down, Depressed, Hopeless 1 0  PHQ - 2 Score 2 0  Altered sleeping 0 0  Tired, decreased energy 2 0  Change in appetite 2 1  Feeling bad or failure about yourself  1 0  Trouble concentrating 0 0  Moving slowly or fidgety/restless 0 0  Suicidal thoughts 0 0  PHQ-9 Score 7 1        04/28/2021   11:59 AM  GAD 7 : Generalized Anxiety Score  Nervous, Anxious, on Edge 0  Control/stop worrying 0  Worry too much - different things 0  Trouble relaxing 0  Restless 0  Easily annoyed or irritable 0  Afraid - awful might happen 0  Total GAD 7 Score 0      Review of Systems:   Pertinent items are noted in HPI Denies abnormal vaginal discharge w/ itching/odor/irritation, headaches, visual changes, shortness of breath, chest pain, abdominal pain, severe nausea/vomiting, or problems with urination or bowel movements unless otherwise stated above. Pertinent History Reviewed:  Reviewed past medical,surgical, social, obstetrical and family history.  Reviewed problem list, medications and allergies. Physical Assessment:   Vitals:   05/09/22 1007  BP: 111/64  Pulse: 72  Weight: 163 lb (73.9 kg)  Body mass index is 28.87 kg/m.        Physical Examination:   General appearance: Well appearing, and in no distress  Mental status: Alert, oriented to person, place, and time  Skin: Warm & dry  Cardiovascular: Normal heart rate  noted  Respiratory: Normal respiratory effort, no distress  Abdomen: Soft, gravid, nontender  Pelvic: Cervical exam deferred         Extremities:    Fetal Status: Fetal Heart Rate (bpm): 152 Fundal Height: 29 cm Movement: Present    Chaperone: N/A   No results found for this or any previous visit (from the past 24 hour(s)).  Assessment & Plan:  1) Low-risk pregnancy G1P0000 at [redacted]w[redacted]d with an Estimated Date of Delivery: 07/15/22    Meds: No orders of the defined types were placed in this encounter.  Labs/procedures today: tdap  Plan:  Continue routine obstetrical care  Next visit: prefers in person    Reviewed: Preterm labor symptoms and general obstetric precautions including but not limited to vaginal bleeding, contractions, leaking of fluid and fetal movement were reviewed in detail with the patient.  All questions were answered. Does have home bp cuff. Office bp cuff given: not applicable. Check bp weekly, let us know if consistently >140 and/or >90.  Follow-up: Return in about 2 weeks (around 05/23/2022) for LROB, CNM, in person.  Future Appointments  Date Time Provider Department Center  05/23/2022 11:30 AM Cheral Marker, CNM [facility]    Orders Placed This Encounter  Procedures   Tdap vaccine greater than or equal to 7yo IM   Cheral Marker CNM, [facility] 05/09/2022 10:42 AM

## 2022-05-09 NOTE — Progress Notes (Signed)
Patient here for routine prenatal care.  Tdap vaccine explained and administered into right deltoid. Patient tolerated it well, there were no complications.    Kathleen Schmidt, CMA   05/09/22

## 2022-05-23 ENCOUNTER — Ambulatory Visit (INDEPENDENT_AMBULATORY_CARE_PROVIDER_SITE_OTHER): Payer: Medicaid Other | Admitting: Women's Health

## 2022-05-23 ENCOUNTER — Encounter: Payer: Self-pay | Admitting: Women's Health

## 2022-05-23 VITALS — BP 120/75 | HR 97 | Wt 166.0 lb

## 2022-05-23 DIAGNOSIS — Z3403 Encounter for supervision of normal first pregnancy, third trimester: Secondary | ICD-10-CM

## 2022-05-23 NOTE — Progress Notes (Signed)
    LOW-RISK PREGNANCY VISIT Patient name: Kathleen Schmidt MRN [id number]  Date of birth: 11/01/1996 Chief Complaint:   Routine Prenatal Visit  History of Present Illness:   Kathleen Schmidt is a 26 y.o. G32P0000 female at [redacted]w[redacted]d with an Estimated Date of Delivery: 07/15/22 being seen today for ongoing management of a low-risk pregnancy.   Today she reports no complaints. Contractions: Not present. Vag. Bleeding: None.  Movement: Present. denies leaking of fluid.     01/06/2022    9:29 AM 04/28/2021   11:58 AM  Depression screen PHQ 2/9  Decreased Interest 1 0  Down, Depressed, Hopeless 1 0  PHQ - 2 Score 2 0  Altered sleeping 0 0  Tired, decreased energy 2 0  Change in appetite 2 1  Feeling bad or failure about yourself  1 0  Trouble concentrating 0 0  Moving slowly or fidgety/restless 0 0  Suicidal thoughts 0 0  PHQ-9 Score 7 1        04/28/2021   11:59 AM  GAD 7 : Generalized Anxiety Score  Nervous, Anxious, on Edge 0  Control/stop worrying 0  Worry too much - different things 0  Trouble relaxing 0  Restless 0  Easily annoyed or irritable 0  Afraid - awful might happen 0  Total GAD 7 Score 0      Review of Systems:   Pertinent items are noted in HPI Denies abnormal vaginal discharge w/ itching/odor/irritation, headaches, visual changes, shortness of breath, chest pain, abdominal pain, severe nausea/vomiting, or problems with urination or bowel movements unless otherwise stated above. Pertinent History Reviewed:  Reviewed past medical,surgical, social, obstetrical and family history.  Reviewed problem list, medications and allergies. Physical Assessment:   Vitals:   05/23/22 1135  BP: 120/75  Pulse: 97  Weight: 166 lb (75.3 kg)  Body mass index is 29.41 kg/m.        Physical Examination:   General appearance: Well appearing, and in no distress  Mental status: Alert, oriented to person, place, and time  Skin: Warm & dry  Cardiovascular: Normal heart rate  noted  Respiratory: Normal respiratory effort, no distress  Abdomen: Soft, gravid, nontender  Pelvic: Cervical exam deferred         Extremities: Edema: None  Fetal Status: Fetal Heart Rate (bpm): 160 Fundal Height: 31 cm Movement: Present    Chaperone: N/A   No results found for this or any previous visit (from the past 24 hour(s)).  Assessment & Plan:  1) Low-risk pregnancy G1P0000 at [redacted]w[redacted]d with an Estimated Date of Delivery: 07/15/22    Meds: No orders of the defined types were placed in this encounter.  Labs/procedures today: none  Plan:  Continue routine obstetrical care  Next visit: prefers in person    Reviewed: Preterm labor symptoms and general obstetric precautions including but not limited to vaginal bleeding, contractions, leaking of fluid and fetal movement were reviewed in detail with the patient.  All questions were answered. Does have home bp cuff. Office bp cuff given: not applicable. Check bp weekly, let us know if consistently >140 and/or >90.  Follow-up: Return in about 2 weeks (around 06/06/2022) for LROB, CNM, in person.  No future appointments.  No orders of the defined types were placed in this encounter.  Cheral Marker CNM, [facility] 05/23/2022 11:49 AM

## 2022-05-23 NOTE — Patient Instructions (Signed)
Kathleen Schmidt, thank you for choosing our office today! We appreciate the opportunity to meet your healthcare needs. You may receive a short survey by mail, e-mail, or through [employer]. If you are happy with your care we would appreciate if you could take just a few minutes to complete the survey questions. We read all of your comments and take your feedback very seriously. Thank you again for choosing our office.  Center for [employer] Team at [facility]  [facility] at [facility] ([address] [place], [place] [address]) Entrance C, located off of [address] Free 24/7 valet parking   CLASSES: Go to [email or web address] to register for classes (childbirth, breastfeeding, waterbirth, infant CPR, daddy bootcamp, etc.)  Call the office [phone]) or go to [facility] if: You begin to have strong, frequent contractions Your water breaks.  Sometimes it is a big gush of fluid, sometimes it is just a trickle that keeps getting your panties wet or running down your legs You have vaginal bleeding.  It is normal to have a small amount of spotting if your cervix was checked.  You don't feel your baby moving like normal.  If you don't, get you something to eat and drink and lay down and focus on feeling your baby move.   If your baby is still not moving like normal, you should call the office or go to [facility]  Call the office [phone]) or go to [facility] for these signs of pre-eclampsia: Severe headache that does not go away with Tylenol Visual changes- seeing spots, double, blurred vision Pain under your right breast or upper abdomen that does not go away with Tums or heartburn medicine Nausea and/or vomiting Severe swelling in your hands, feet, and face   Tdap Vaccine It is recommended that you get the Tdap vaccine during the third trimester of EACH pregnancy to help protect your baby from getting pertussis (whooping cough) 27-36 weeks is the BEST time to do  this so that you can pass the protection on to your baby. During pregnancy is better than after pregnancy, but if you are unable to get it during pregnancy it will be offered at the hospital.  You can get this vaccine with [place], at the health department, your family doctor, or some local pharmacies Everyone who will be around your baby should also be up-to-date on their vaccines before the baby comes. Adults (who are not pregnant) only need 1 dose of Tdap during adulthood.   [facility] Pediatricians/Family Doctors [facility]): [address] Colette Ribas, [phone]           [facility]: [address], [phone]                [facility]): [address], [phone] (call to ask if accepting patients) [facility] [address] [facility] Department: [address], [place], [phone]    [facility] Pediatricians/Family Doctors [facility]): [phone], [phone] [facility]: [address] [place], [phone] [facility]: [address], [phone]  [facility] Doctors  [facility]): [phone] [facility]: [address], [phone]   [facility] Doctors [facility]: [address], [phone]  [facility] Family Doctors  [facility]: [phone], [phone]  Home Blood Pressure Monitoring for Patients   Your provider has recommended that you check your  blood pressure (BP) at least once a week at home. If you do not have a blood pressure cuff at home, one will be provided for you. Contact your provider if you have not received your monitor within 1 week.   Helpful Tips for Accurate Home Blood Pressure Checks  Don't smoke, exercise, or drink caffeine 30 minutes before checking your BP Use the restroom before checking your BP (a full bladder can raise your  pressure) Relax in a comfortable upright chair Feet on the ground Left arm resting comfortably on a flat surface at the level of your heart Legs uncrossed Back supported Sit quietly and don't talk Place the cuff on your bare arm Adjust snuggly, so that only two fingertips can fit between your skin and the top of the cuff Check 2 readings separated by at least one minute Keep a log of your BP readings For a visual, please reference this diagram: http://ccnc.care/bpdiagram  Provider Name: [facility]     Phone: [phone]  Zone 1: ALL CLEAR  Continue to monitor your symptoms:  BP reading is less than 140 (top number) or less than 90 (bottom number)  No right upper stomach pain No headaches or seeing spots No feeling nauseated or throwing up No swelling in face and hands  Zone 2: CAUTION Call your doctor's office for any of the following:  BP reading is greater than 140 (top number) or greater than 90 (bottom number)  Stomach pain under your ribs in the middle or right side Headaches or seeing spots Feeling nauseated or throwing up Swelling in face and hands  Zone 3: EMERGENCY  Seek immediate medical care if you have any of the following:  BP reading is greater than160 (top number) or greater than 110 (bottom number) Severe headaches not improving with Tylenol Serious difficulty catching your breath Any worsening symptoms from Zone 2  Preterm Labor and Birth Information  The normal length of a pregnancy is 39-41 weeks. Preterm labor is when labor starts before 37 completed weeks of pregnancy. What are the risk factors for preterm labor? Preterm labor is more likely to occur in women who: Have certain infections during pregnancy such as a bladder infection, sexually transmitted infection, or infection inside the uterus (chorioamnionitis). Have a shorter-than-normal cervix. Have gone into preterm labor before. Have had surgery on their cervix. Are younger than age 17  or older than age 35. Are African American. Are pregnant with twins or multiple babies (multiple gestation). Take street drugs or smoke while pregnant. Do not gain enough weight while pregnant. Became pregnant shortly after having been pregnant. What are the symptoms of preterm labor? Symptoms of preterm labor include: Cramps similar to those that can happen during a menstrual period. The cramps may happen with diarrhea. Pain in the abdomen or lower back. Regular uterine contractions that may feel like tightening of the abdomen. A feeling of increased pressure in the pelvis. Increased watery or bloody mucus discharge from the vagina. Water breaking (ruptured amniotic sac). Why is it important to recognize signs of preterm labor? It is important to recognize signs of preterm labor because babies who are born prematurely may not be fully developed. This can put them at an increased risk for: Long-term (chronic) heart and lung problems. Difficulty immediately after birth with regulating body systems, including blood sugar, body temperature, heart rate, and breathing rate. Bleeding in the brain. Cerebral palsy. Learning difficulties. Death. These risks are highest for babies who are born before 34 weeks   of pregnancy. How is preterm labor treated? Treatment depends on the length of your pregnancy, your condition, and the health of your baby. It may involve: Having a stitch (suture) placed in your cervix to prevent your cervix from opening too early (cerclage). Taking or being given medicines, such as: Hormone medicines. These may be given early in pregnancy to help support the pregnancy. Medicine to stop contractions. Medicines to help mature the baby's lungs. These may be prescribed if the risk of delivery is high. Medicines to prevent your baby from developing cerebral palsy. If the labor happens before 34 weeks of pregnancy, you may need to stay in the hospital. What should I do if I  think I am in preterm labor? If you think that you are going into preterm labor, call your health care provider right away. How can I prevent preterm labor in future pregnancies? To increase your chance of having a full-term pregnancy: Do not use any tobacco products, such as cigarettes, chewing tobacco, and e-cigarettes. If you need help quitting, ask your health care provider. Do not use street drugs or medicines that have not been prescribed to you during your pregnancy. Talk with your health care provider before taking any herbal supplements, even if you have been taking them regularly. Make sure you gain a healthy amount of weight during your pregnancy. Watch for infection. If you think that you might have an infection, get it checked right away. Make sure to tell your health care provider if you have gone into preterm labor before. This information is not intended to replace advice given to you by your health care provider. Make sure you discuss any questions you have with your health care provider. Document Revised: 03/14/2019 Document Reviewed: 04/12/2016 Elsevier Patient Education  2020 Elsevier Inc.   

## 2022-06-07 ENCOUNTER — Ambulatory Visit (INDEPENDENT_AMBULATORY_CARE_PROVIDER_SITE_OTHER): Payer: Medicaid Other | Admitting: Obstetrics & Gynecology

## 2022-06-07 ENCOUNTER — Encounter: Payer: Self-pay | Admitting: Obstetrics & Gynecology

## 2022-06-07 VITALS — BP 115/70 | HR 71 | Wt 169.6 lb

## 2022-06-07 DIAGNOSIS — Z3403 Encounter for supervision of normal first pregnancy, third trimester: Secondary | ICD-10-CM

## 2022-06-07 NOTE — Progress Notes (Signed)
   LOW-RISK PREGNANCY VISIT Patient name: Kathleen Schmidt MRN [id number]  Date of birth: 1996-08-11 Chief Complaint:   Routine Prenatal Visit  History of Present Illness:   Kathleen Schmidt is a 26 y.o. G63P0000 female at [redacted]w[redacted]d with an Estimated Date of Delivery: 07/15/22 being seen today for ongoing management of a low-risk pregnancy.     01/06/2022    9:29 AM 04/28/2021   11:58 AM  Depression screen PHQ 2/9  Decreased Interest 1 0  Down, Depressed, Hopeless 1 0  PHQ - 2 Score 2 0  Altered sleeping 0 0  Tired, decreased energy 2 0  Change in appetite 2 1  Feeling bad or failure about yourself  1 0  Trouble concentrating 0 0  Moving slowly or fidgety/restless 0 0  Suicidal thoughts 0 0  PHQ-9 Score 7 1    Today she reports no complaints. Contractions: Not present. Vag. Bleeding: None.  Movement: Present. denies leaking of fluid. Review of Systems:   Pertinent items are noted in HPI Denies abnormal vaginal discharge w/ itching/odor/irritation, headaches, visual changes, shortness of breath, chest pain, abdominal pain, severe nausea/vomiting, or problems with urination or bowel movements unless otherwise stated above. Pertinent History Reviewed:  Reviewed past medical,surgical, social, obstetrical and family history.  Reviewed problem list, medications and allergies.  Physical Assessment:   Vitals:   06/07/22 1415  BP: 115/70  Pulse: 71  Weight: 169 lb 9.6 oz (76.9 kg)  Body mass index is 30.04 kg/m.        Physical Examination:   General appearance: Well appearing, and in no distress  Mental status: Alert, oriented to person, place, and time  Skin: Warm & dry  Respiratory: Normal respiratory effort, no distress  Abdomen: Soft, gravid, nontender  Pelvic: Cervical exam deferred         Extremities: Edema: None  Psych:  mood and affect appropriate  Fetal Status: Fetal Heart Rate (bpm): 130 Fundal Height: 33 cm Movement: Present    Chaperone: n/a    No results found for  this or any previous visit (from the past 24 hour(s)).   Assessment & Plan:  1) Low-risk pregnancy G1P0000 at [redacted]w[redacted]d with an Estimated Date of Delivery: 07/15/22     Meds: No orders of the defined types were placed in this encounter.  Labs/procedures today: none, []  GBS next visit  Plan:  Continue routine obstetrical care  Next visit: prefers in person    Reviewed: Preterm labor symptoms and general obstetric precautions including but not limited to vaginal bleeding, contractions, leaking of fluid and fetal movement were reviewed in detail with the patient.  All questions were answered.   Follow-up: Return in about 2 weeks (around 06/21/2022) for LROB visit.  No orders of the defined types were placed in this encounter.   06/23/2022, DO Attending Obstetrician & Gynecologist, [facility] for [facility], [facility]   ,[facility] 0

## 2022-06-21 ENCOUNTER — Other Ambulatory Visit (HOSPITAL_COMMUNITY)
Admission: RE | Admit: 2022-06-21 | Discharge: 2022-06-21 | Disposition: A | Discharge status: Home / Self Care | Payer: Medicaid Other | Source: Ambulatory Visit | Attending: Obstetrics & Gynecology | Admitting: Obstetrics & Gynecology

## 2022-06-21 ENCOUNTER — Encounter: Payer: Self-pay | Admitting: Obstetrics & Gynecology

## 2022-06-21 ENCOUNTER — Ambulatory Visit (INDEPENDENT_AMBULATORY_CARE_PROVIDER_SITE_OTHER): Payer: Medicaid Other | Admitting: Obstetrics & Gynecology

## 2022-06-21 VITALS — BP 111/71 | HR 79 | Wt 172.4 lb

## 2022-06-21 DIAGNOSIS — Z3A36 36 weeks gestation of pregnancy: Secondary | ICD-10-CM

## 2022-06-21 DIAGNOSIS — Z113 Encounter for screening for infections with a predominantly sexual mode of transmission: Secondary | ICD-10-CM | POA: Diagnosis not present

## 2022-06-21 DIAGNOSIS — Z3403 Encounter for supervision of normal first pregnancy, third trimester: Secondary | ICD-10-CM | POA: Insufficient documentation

## 2022-06-21 NOTE — Progress Notes (Signed)
   LOW-RISK PREGNANCY VISIT Patient name: Kathleen Schmidt MRN [id number]  Date of birth: 09/12/1996 Chief Complaint:   Routine Prenatal Visit  History of Present Illness:   Kathleen Schmidt is a 26 y.o. G37P0000 female at [redacted]w[redacted]d with an Estimated Date of Delivery: 07/15/22 being seen today for ongoing management of a low-risk pregnancy.     01/06/2022    9:29 AM 04/28/2021   11:58 AM  Depression screen PHQ 2/9  Decreased Interest 1 0  Down, Depressed, Hopeless 1 0  PHQ - 2 Score 2 0  Altered sleeping 0 0  Tired, decreased energy 2 0  Change in appetite 2 1  Feeling bad or failure about yourself  1 0  Trouble concentrating 0 0  Moving slowly or fidgety/restless 0 0  Suicidal thoughts 0 0  PHQ-9 Score 7 1    Today she reports no complaints. Contractions: Not present. Vag. Bleeding: None.  Movement: Present. denies leaking of fluid. Review of Systems:   Pertinent items are noted in HPI Denies abnormal vaginal discharge w/ itching/odor/irritation, headaches, visual changes, shortness of breath, chest pain, abdominal pain, severe nausea/vomiting, or problems with urination or bowel movements unless otherwise stated above. Pertinent History Reviewed:  Reviewed past medical,surgical, social, obstetrical and family history.  Reviewed problem list, medications and allergies.  Physical Assessment:   Vitals:   06/21/22 1402  BP: 111/71  Pulse: 79  Weight: 172 lb 6.4 oz (78.2 kg)  Body mass index is 30.54 kg/m.        Physical Examination:   General appearance: Well appearing, and in no distress  Mental status: Alert, oriented to person, place, and time  Skin: Warm & dry  Respiratory: Normal respiratory effort, no distress  Abdomen: Soft, gravid, nontender  Pelvic: Cervical exam deferred pt declined cervical check.  Vaginal swabs obtained        Extremities: Edema: None  Psych:  mood and affect appropriate  Fetal Status: Fetal Heart Rate (bpm): 135 Fundal Height: 35 cm Movement:  Present    Chaperone:  pt declined     No results found for this or any previous visit (from the past 24 hour(s)).   Assessment & Plan:  1) Low-risk pregnancy G1P0000 at [redacted]w[redacted]d with an Estimated Date of Delivery: 07/15/22     Meds: No orders of the defined types were placed in this encounter.  Labs/procedures today: GBS, GC/C  Plan:  Continue routine obstetrical care  Next visit: prefers in person    Reviewed: Preterm labor symptoms and general obstetric precautions including but not limited to vaginal bleeding, contractions, leaking of fluid and fetal movement were reviewed in detail with the patient.  All questions were answered.   Follow-up: Return in about 1 week (around 06/28/2022) for LROB visit.  Orders Placed This Encounter  Procedures   Culture, beta strep (group b only)    Myna Hidalgo, DO Attending Obstetrician & Gynecologist, [facility] Center for [employer], [facility]

## 2022-06-23 LAB — CERVICOVAGINAL ANCILLARY ONLY
Chlamydia: NEGATIVE
Comment: NEGATIVE
Comment: NORMAL
Neisseria Gonorrhea: NEGATIVE

## 2022-06-24 LAB — CULTURE, BETA STREP (GROUP B ONLY): Strep Gp B Culture: POSITIVE — AB

## 2022-06-28 ENCOUNTER — Ambulatory Visit (INDEPENDENT_AMBULATORY_CARE_PROVIDER_SITE_OTHER): Payer: Medicaid Other | Admitting: Advanced Practice Midwife

## 2022-06-28 ENCOUNTER — Encounter: Payer: Self-pay | Admitting: Advanced Practice Midwife

## 2022-06-28 VITALS — BP 124/79 | HR 73 | Wt 174.4 lb

## 2022-06-28 DIAGNOSIS — Z3A37 37 weeks gestation of pregnancy: Secondary | ICD-10-CM

## 2022-06-28 DIAGNOSIS — Z3403 Encounter for supervision of normal first pregnancy, third trimester: Secondary | ICD-10-CM

## 2022-06-28 DIAGNOSIS — B951 Streptococcus, group B, as the cause of diseases classified elsewhere: Secondary | ICD-10-CM

## 2022-06-28 NOTE — Patient Instructions (Signed)
Try the positions on [email or web address] 

## 2022-06-28 NOTE — Progress Notes (Signed)
   LOW-RISK PREGNANCY VISIT Patient name: Kathleen Schmidt MRN [id number]  Date of birth: 1996-08-10 Chief Complaint:   Routine Prenatal Visit  History of Present Illness:   Kathleen Schmidt is a 26 y.o. G32P0000 female at [redacted]w[redacted]d with an Estimated Date of Delivery: 07/15/22 being seen today for ongoing management of a low-risk pregnancy.  Today she reports no complaints. Contractions: Not present.  .  Movement: Present. denies leaking of fluid. Review of Systems:   Pertinent items are noted in HPI Denies abnormal vaginal discharge w/ itching/odor/irritation, headaches, visual changes, shortness of breath, chest pain, abdominal pain, severe nausea/vomiting, or problems with urination or bowel movements unless otherwise stated above. Pertinent History Reviewed:  Reviewed past medical,surgical, social, obstetrical and family history.  Reviewed problem list, medications and allergies. Physical Assessment:   Vitals:   06/28/22 1328  BP: 124/79  Pulse: 73  Weight: 174 lb 6.4 oz (79.1 kg)  Body mass index is 30.89 kg/m.        Physical Examination:   General appearance: Well appearing, and in no distress  Mental status: Alert, oriented to person, place, and time  Skin: Warm & dry  Cardiovascular: Normal heart rate noted  Respiratory: Normal respiratory effort, no distress  Abdomen: Soft, gravid, nontender  Pelvic: Cervical exam deferred         Extremities: Edema: Trace  Fetal Status: Fetal Heart Rate (bpm): 140 Fundal Height: 35 cm Movement: Present Presentation: Vertex  No results found for this or any previous visit (from the past 24 hour(s)).  Assessment & Plan:  1) Low-risk pregnancy G1P0000 at [redacted]w[redacted]d with an Estimated Date of Delivery: 07/15/22   2) Rev'd labor s/s and encouraged [employer],   Meds: No orders of the defined types were placed in this encounter.  Labs/procedures today: none  Plan:  Continue routine obstetrical care   Reviewed: Term labor symptoms and general  obstetric precautions including but not limited to vaginal bleeding, contractions, leaking of fluid and fetal movement were reviewed in detail with the patient.  All questions were answered. Didn't ask about home bp cuff.  Check bp weekly, let us know if >140/90.   Follow-up: Return in about 1 week (around 07/05/2022) for schedule , LROB, in person; schedule out x 2 visits.  No orders of the defined types were placed in this encounter.  Arabella Merles CNM 06/28/2022 1:55 PM

## 2022-07-05 ENCOUNTER — Encounter: Payer: Self-pay | Admitting: Advanced Practice Midwife

## 2022-07-05 ENCOUNTER — Ambulatory Visit (INDEPENDENT_AMBULATORY_CARE_PROVIDER_SITE_OTHER): Payer: Medicaid Other | Admitting: Advanced Practice Midwife

## 2022-07-05 VITALS — BP 120/75 | HR 66 | Wt 174.6 lb

## 2022-07-05 DIAGNOSIS — Z3403 Encounter for supervision of normal first pregnancy, third trimester: Secondary | ICD-10-CM

## 2022-07-05 DIAGNOSIS — Z3A38 38 weeks gestation of pregnancy: Secondary | ICD-10-CM

## 2022-07-05 NOTE — Progress Notes (Signed)
   LOW-RISK PREGNANCY VISIT Patient name: Kathleen Schmidt MRN [id number]  Date of birth: Apr 13, 1996 Chief Complaint:   Routine Prenatal Visit  History of Present Illness:   Kathleen Schmidt is a 26 y.o. G31P0000 female at [redacted]w[redacted]d with an Estimated Date of Delivery: 07/15/22 being seen today for ongoing management of a low-risk pregnancy.  Today she reports no complaints. Contractions: Not present.  .  Movement: Present. denies leaking of fluid. Review of Systems:   Pertinent items are noted in HPI Denies abnormal vaginal discharge w/ itching/odor/irritation, headaches, visual changes, shortness of breath, chest pain, abdominal pain, severe nausea/vomiting, or problems with urination or bowel movements unless otherwise stated above. Pertinent History Reviewed:  Reviewed past medical,surgical, social, obstetrical and family history.  Reviewed problem list, medications and allergies. Physical Assessment:   Vitals:   07/05/22 1343  BP: 120/75  Pulse: 66  Weight: 174 lb 9.6 oz (79.2 kg)  Body mass index is 30.93 kg/m.        Physical Examination:   General appearance: Well appearing, and in no distress  Mental status: Alert, oriented to person, place, and time  Skin: Warm & dry  Cardiovascular: Normal heart rate noted  Respiratory: Normal respiratory effort, no distress  Abdomen: Soft, gravid, nontender  Pelvic: Cervical exam deferred         Extremities: Edema: None  Fetal Status: Fetal Heart Rate (bpm): 158 Fundal Height: 36 cm Movement: Present Presentation: Vertex  No results found for this or any previous visit (from the past 24 hour(s)).  Assessment & Plan:  1) Low-risk pregnancy G1P0000 at [redacted]w[redacted]d with an Estimated Date of Delivery: 07/15/22     Meds: No orders of the defined types were placed in this encounter.  Labs/procedures today: none  Plan:  Continue routine obstetrical care   Reviewed: Term labor symptoms and general obstetric precautions including but not limited to  vaginal bleeding, contractions, leaking of fluid and fetal movement were reviewed in detail with the patient.  All questions were answered. Has home bp cuff.  Check bp weekly, let us know if >140/90.   Follow-up: Return for As scheduled.  No orders of the defined types were placed in this encounter.  Arabella Merles CNM 07/05/2022 1:56 PM

## 2022-07-12 ENCOUNTER — Encounter: Payer: Self-pay | Admitting: Obstetrics & Gynecology

## 2022-07-12 ENCOUNTER — Ambulatory Visit (INDEPENDENT_AMBULATORY_CARE_PROVIDER_SITE_OTHER): Payer: Medicaid Other | Admitting: Obstetrics & Gynecology

## 2022-07-12 VITALS — BP 124/77 | HR 72 | Wt 174.0 lb

## 2022-07-12 DIAGNOSIS — Z3A39 39 weeks gestation of pregnancy: Secondary | ICD-10-CM

## 2022-07-12 DIAGNOSIS — Z3403 Encounter for supervision of normal first pregnancy, third trimester: Secondary | ICD-10-CM

## 2022-07-12 NOTE — Progress Notes (Signed)
   LOW-RISK PREGNANCY VISIT Patient name: Kathleen Schmidt MRN [id number]  Date of birth: 07/06/1996 Chief Complaint:   Routine Prenatal Visit  History of Present Illness:   Kathleen Schmidt is a 26 y.o. G52P0000 female at [redacted]w[redacted]d with an Estimated Date of Delivery: 07/15/22 being seen today for ongoing management of a low-risk pregnancy.       01/06/2022    9:29 AM 04/28/2021   11:58 AM  Depression screen PHQ 2/9  Decreased Interest 1 0  Down, Depressed, Hopeless 1 0  PHQ - 2 Score 2 0  Altered sleeping 0 0  Tired, decreased energy 2 0  Change in appetite 2 1  Feeling bad or failure about yourself  1 0  Trouble concentrating 0 0  Moving slowly or fidgety/restless 0 0  Suicidal thoughts 0 0  PHQ-9 Score 7 1    Today she reports no complaints. Contractions: Not present. Vag. Bleeding: None.  Movement: Present. denies leaking of fluid. Review of Systems:   Pertinent items are noted in HPI Denies abnormal vaginal discharge w/ itching/odor/irritation, headaches, visual changes, shortness of breath, chest pain, abdominal pain, severe nausea/vomiting, or problems with urination or bowel movements unless otherwise stated above. Pertinent History Reviewed:  Reviewed past medical,surgical, social, obstetrical and family history.  Reviewed problem list, medications and allergies.  Physical Assessment:   Vitals:   07/12/22 1343  BP: 124/77  Pulse: 72  Weight: 174 lb (78.9 kg)  Body mass index is 30.82 kg/m.        Physical Examination:   General appearance: Well appearing, and in no distress  Mental status: Alert, oriented to person, place, and time  Skin: Warm & dry  Respiratory: Normal respiratory effort, no distress  Abdomen: Soft, gravid, nontender  Pelvic: Cervical exam deferred  Pt declined exam today       Extremities: Edema: Trace  Psych:  mood and affect appropriate  Fetal Status: Fetal Heart Rate (bpm): 135 Fundal Height: 38 cm Movement: Present    Chaperone: n/a    No  results found for this or any previous visit (from the past 24 hour(s)).   Assessment & Plan:  1) Low-risk pregnancy G1P0000 at [redacted]w[redacted]d with an Estimated Date of Delivery: 07/15/22   -NST next visit if still pregnant, will plan to schedule IOL around 41wks -encouraged pt to complete SVE next visit   Meds: No orders of the defined types were placed in this encounter.  Labs/procedures today: none  Plan:  Continue routine obstetrical care  Next visit: prefers in person    Reviewed: Term labor symptoms and general obstetric precautions including but not limited to vaginal bleeding, contractions, leaking of fluid and fetal movement were reviewed in detail with the patient.  All questions were answered. Pt has home bp cuff. Check bp weekly, let us know if >140/90.   Follow-up: Return in about 1 week (around 07/19/2022) for LROB visit and NST (if possible Mon/Tues).  No orders of the defined types were placed in this encounter.   Myna Hidalgo, DO Attending Obstetrician & Gynecologist, [facility] for [employer], [facility]

## 2022-07-13 ENCOUNTER — Inpatient Hospital Stay (HOSPITAL_COMMUNITY): Payer: Medicaid Other | Admitting: Anesthesiology

## 2022-07-13 ENCOUNTER — Encounter (HOSPITAL_COMMUNITY): Payer: Self-pay | Admitting: Family Medicine

## 2022-07-13 ENCOUNTER — Other Ambulatory Visit: Payer: Self-pay

## 2022-07-13 ENCOUNTER — Inpatient Hospital Stay (HOSPITAL_COMMUNITY)
Admission: AD | Admit: 2022-07-13 | Discharge: 2022-07-15 | DRG: 807 | Disposition: A | Discharge status: Home / Self Care | Payer: Medicaid Other | Attending: Family Medicine | Admitting: Family Medicine

## 2022-07-13 DIAGNOSIS — Z3A39 39 weeks gestation of pregnancy: Secondary | ICD-10-CM | POA: Diagnosis not present

## 2022-07-13 DIAGNOSIS — O99824 Streptococcus B carrier state complicating childbirth: Secondary | ICD-10-CM | POA: Diagnosis not present

## 2022-07-13 DIAGNOSIS — Z87891 Personal history of nicotine dependence: Secondary | ICD-10-CM

## 2022-07-13 DIAGNOSIS — Z3403 Encounter for supervision of normal first pregnancy, third trimester: Principal | ICD-10-CM

## 2022-07-13 DIAGNOSIS — B951 Streptococcus, group B, as the cause of diseases classified elsewhere: Secondary | ICD-10-CM

## 2022-07-13 DIAGNOSIS — O26893 Other specified pregnancy related conditions, third trimester: Secondary | ICD-10-CM | POA: Diagnosis not present

## 2022-07-13 LAB — CBC
HCT: 31.5 % — ABNORMAL LOW (ref 36.0–46.0)
Hemoglobin: 10.6 g/dL — ABNORMAL LOW (ref 12.0–15.0)
MCH: 29.2 pg (ref 26.0–34.0)
MCHC: 33.7 g/dL (ref 30.0–36.0)
MCV: 86.8 fL (ref 80.0–100.0)
Platelets: 235 10*3/uL (ref 150–400)
RBC: 3.63 MIL/uL — ABNORMAL LOW (ref 3.87–5.11)
RDW: 14.6 % (ref 11.5–15.5)
WBC: 10.1 10*3/uL (ref 4.0–10.5)
nRBC: 0 % (ref 0.0–0.2)

## 2022-07-13 LAB — POCT FERN TEST: POCT Fern Test: NEGATIVE

## 2022-07-13 LAB — TYPE AND SCREEN
ABO/RH(D): O POS
Antibody Screen: NEGATIVE

## 2022-07-13 MED ORDER — LACTATED RINGERS IV SOLN: 500.0000 mL | INTRAVENOUS | Status: DC | PRN

## 2022-07-13 MED ORDER — DIPHENHYDRAMINE HCL 50 MG/ML IJ SOLN: 12.5000 mg | INTRAMUSCULAR | Status: DC | PRN

## 2022-07-13 MED ORDER — SOD CITRATE-CITRIC ACID 500-334 MG/5ML PO SOLN: 30.0000 mL | ORAL | Status: DC | PRN

## 2022-07-13 MED ORDER — LACTATED RINGERS IV SOLN
INTRAVENOUS | Status: DC
Start: 2022-07-13 — End: 2022-07-14

## 2022-07-13 MED ORDER — PHENYLEPHRINE 80 MCG/ML (10ML) SYRINGE FOR IV PUSH (FOR BLOOD PRESSURE SUPPORT): 80.0000 ug | PREFILLED_SYRINGE | INTRAVENOUS | Status: DC | PRN

## 2022-07-13 MED ORDER — ONDANSETRON HCL 4 MG/2ML IJ SOLN: 4.0000 mg | Freq: Four times a day (QID) | INTRAMUSCULAR | Status: DC | PRN

## 2022-07-13 MED ORDER — EPHEDRINE 5 MG/ML INJ: 10.0000 mg | INTRAVENOUS | Status: DC | PRN

## 2022-07-13 MED ORDER — LACTATED RINGERS IV SOLN
500.0000 mL | Freq: Once | INTRAVENOUS | Status: AC
  Administered 2022-07-13: 500 mL via INTRAVENOUS

## 2022-07-13 MED ORDER — LIDOCAINE HCL (PF) 1 % IJ SOLN
INTRAMUSCULAR | Status: DC | PRN
  Administered 2022-07-13: 10 mL via EPIDURAL

## 2022-07-13 MED ORDER — FENTANYL CITRATE (PF) 100 MCG/2ML IJ SOLN
50.0000 ug | Freq: Once | INTRAMUSCULAR | Status: AC
  Administered 2022-07-13: 50 ug via INTRAVENOUS
  Filled 2022-07-13: qty 2

## 2022-07-13 MED ORDER — FENTANYL-BUPIVACAINE-NACL 0.5-0.125-0.9 MG/250ML-% EP SOLN
12.0000 mL/h | EPIDURAL | Status: DC | PRN
  Administered 2022-07-13: 12 mL/h via EPIDURAL
  Filled 2022-07-13: qty 250

## 2022-07-13 MED ORDER — OXYCODONE-ACETAMINOPHEN 5-325 MG PO TABS: 1.0000 | ORAL_TABLET | ORAL | Status: DC | PRN

## 2022-07-13 MED ORDER — OXYTOCIN BOLUS FROM INFUSION
333.0000 mL | Freq: Once | INTRAVENOUS | Status: AC
  Administered 2022-07-14: 333 mL via INTRAVENOUS

## 2022-07-13 MED ORDER — ACETAMINOPHEN 325 MG PO TABS
650.0000 mg | ORAL_TABLET | ORAL | Status: DC | PRN
  Administered 2022-07-13: 650 mg via ORAL
  Filled 2022-07-13: qty 2

## 2022-07-13 MED ORDER — FENTANYL-BUPIVACAINE-NACL 0.5-0.125-0.9 MG/250ML-% EP SOLN: 12.0000 mL/h | EPIDURAL | Status: DC | PRN

## 2022-07-13 MED ORDER — PENICILLIN G POT IN DEXTROSE 60000 UNIT/ML IV SOLN
3.0000 10*6.[IU] | INTRAVENOUS | Status: DC
  Administered 2022-07-13 – 2022-07-14 (×3): 3 10*6.[IU] via INTRAVENOUS
  Filled 2022-07-13 (×3): qty 50

## 2022-07-13 MED ORDER — OXYCODONE-ACETAMINOPHEN 5-325 MG PO TABS: 2.0000 | ORAL_TABLET | ORAL | Status: DC | PRN

## 2022-07-13 MED ORDER — FLEET ENEMA 7-19 GM/118ML RE ENEM: 1.0000 | ENEMA | Freq: Every day | RECTAL | Status: DC | PRN

## 2022-07-13 MED ORDER — LIDOCAINE HCL (PF) 1 % IJ SOLN: 30.0000 mL | INTRAMUSCULAR | Status: DC | PRN

## 2022-07-13 MED ORDER — SODIUM CHLORIDE 0.9 % IV SOLN
5.0000 10*6.[IU] | Freq: Once | INTRAVENOUS | Status: AC
  Administered 2022-07-13: 5 10*6.[IU] via INTRAVENOUS
  Filled 2022-07-13: qty 5

## 2022-07-13 MED ORDER — OXYTOCIN-SODIUM CHLORIDE 30-0.9 UT/500ML-% IV SOLN
2.5000 [IU]/h | INTRAVENOUS | Status: DC
  Administered 2022-07-14: 2.5 [IU]/h via INTRAVENOUS
  Filled 2022-07-13: qty 500

## 2022-07-13 NOTE — Anesthesia Preprocedure Evaluation (Signed)
Anesthesia Evaluation  Patient identified by MRN, date of birth, ID band Patient awake    Reviewed: Allergy & Precautions, H&P , NPO status , Patient's Chart, lab work & pertinent test results, reviewed documented beta blocker date and time   Airway Mallampati: II  TM Distance: >3 FB Neck ROM: full    Dental no notable dental hx. (+) Teeth Intact, Dental Advisory Given   Pulmonary neg pulmonary ROS, former smoker,    Pulmonary exam normal breath sounds clear to auscultation       Cardiovascular negative cardio ROS Normal cardiovascular exam Rhythm:regular Rate:Normal     Neuro/Psych negative neurological ROS  negative psych ROS   GI/Hepatic negative GI ROS, Neg liver ROS,   Endo/Other  negative endocrine ROS  Renal/GU negative Renal ROS  negative genitourinary   Musculoskeletal   Abdominal   Peds  Hematology negative hematology ROS (+)   Anesthesia Other Findings   Reproductive/Obstetrics (+) Pregnancy                             Anesthesia Physical Anesthesia Plan  ASA: 2  Anesthesia Plan: Epidural   Post-op Pain Management:    Induction: Intravenous  PONV Risk Score and Plan: 2 and Treatment may vary due to age or medical condition  Airway Management Planned: Natural Airway  Additional Equipment: None  Intra-op Plan:   Post-operative Plan:   Informed Consent: I have reviewed the patients History and Physical, chart, labs and discussed the procedure including the risks, benefits and alternatives for the proposed anesthesia with the patient or authorized representative who has indicated his/her understanding and acceptance.     Dental Advisory Given  Plan Discussed with: Anesthesiologist  Anesthesia Plan Comments: (Labs checked- platelets confirmed with RN in room. Fetal heart tracing, per RN, reported to be stable enough for sitting procedure. Discussed epidural, and  patient consents to the procedure:  included risk of possible headache,backache, failed block, allergic reaction, and nerve injury. This patient was asked if she had any questions or concerns before the procedure started.)        Anesthesia Quick Evaluation

## 2022-07-13 NOTE — Progress Notes (Signed)
Patient ID: Kathleen Schmidt, female   DOB: 08/31/1996, 26 y.o.   MRN: [id number] Doing well, comfortable  Vitals:   07/13/22 1930 07/13/22 2000 07/13/22 2030 07/13/22 2100  BP: 123/68 125/72 130/73 134/78  Pulse: 81 77 82 90  Resp:      Temp:      TempSrc:      SpO2:      Weight:      Height:       FHR reassurng  Dilation: 7.5 Effacement (%): 80 Cervical Position: Posterior Station: -2, -1 Presentation: Vertex Exam by:: Wynelle Bourgeois, CNM  AROM of bulging bag, moderate MSAF

## 2022-07-13 NOTE — H&P (Signed)
OBSTETRIC ADMISSION HISTORY AND PHYSICAL  Kathleen Schmidt is a 26 y.o. female G1P0000 with IUP at [redacted]w[redacted]d by LMP presenting for contractions since 0400 this morning that are occurring every 2-5 minutes. She reports +FMs, No LOF, no VB, no blurry vision, headaches or peripheral edema, and RUQ pain.  She plans on breast and bottle feeding. She request depo for birth control. She received her prenatal care at [facility]   Dating: By LMP --->  Estimated Date of Delivery: 07/15/22  Sono:    @[redacted]w[redacted]d , CWD, normal anatomy, cephalic presentation, 357 g, EFW   Prenatal History/Complications: None  Past Medical History: Past Medical History:  Diagnosis Date   Medical history non-contributory     Past Surgical History: Past Surgical History:  Procedure Laterality Date   NO PAST SURGERIES      Obstetrical History: OB History     Gravida  1   Para  0   Term  0   Preterm  0   AB  0   Living  0      SAB  0   IAB  0   Ectopic  0   Multiple  0   Live Births  0           Social History Social History   Socioeconomic History   Marital status: Significant Other    Spouse name: Not on file   Number of children: Not on file   Years of education: Not on file   Highest education level: Not on file  Occupational History   Not on file  Tobacco Use   Smoking status: Former    Types: Cigarettes    Quit date: 2019    Years since quitting: 4.6   Smokeless tobacco: Never  Vaping Use   Vaping Use: Never used  Substance and Sexual Activity   Alcohol use: No   Drug use: No   Sexual activity: Yes    Birth control/protection: None  Other Topics Concern   Not on file  Social History Narrative   Not on file   Social Determinants of Health   Financial Resource Strain: Medium Risk (01/06/2022)   Overall Financial Resource Strain (CARDIA)    Difficulty of Paying Living Expenses: Somewhat hard  Food Insecurity: No Food Insecurity (01/06/2022)   Hunger Vital Sign     Worried About Running Out of Food in the Last Year: Never true    Ran Out of Food in the Last Year: Never true  Transportation Needs: No Transportation Needs (01/06/2022)   PRAPARE - 03/06/2022 (Medical): No    Lack of Transportation (Non-Medical): No  Physical Activity: Inactive (01/06/2022)   Exercise Vital Sign    Days of Exercise per Week: 0 days    Minutes of Exercise per Session: 0 min  Stress: No Stress Concern Present (01/06/2022)   03/06/2022 of Occupational Health - Occupational Stress Questionnaire    Feeling of Stress : Only a little  Social Connections: Socially Isolated (01/06/2022)   Social Connection and Isolation Panel [NHANES]    Frequency of Communication with Friends and Family: Once a week    Frequency of Social Gatherings with Friends and Family: Once a week    Attends Religious Services: Never    03/06/2022 or Organizations: No    Attends Database administrator Meetings: Never    Marital Status: Living with partner    Family History: Family History  Problem Relation Age  of Onset   Kidney failure Mother    Hypertension Mother    Diabetes Mother    Hypercholesterolemia Mother    Diabetes Maternal Grandmother    Stroke Maternal Grandfather     Allergies: No Known Allergies  Medications Prior to Admission  Medication Sig Dispense Refill Last Dose   Prenatal Vit-Fe Fumarate-FA (PRENATAL VITAMIN PO) Take by mouth.   07/12/2022     Review of Systems   All systems reviewed and negative except as stated in HPI  Blood pressure (!) 142/80, pulse 87, temperature 98.6 F (37 C), temperature source Oral, resp. rate 14, height 5\' 3"  (1.6 m), weight 79.4 kg, last menstrual period 10/08/2021, SpO2 97 %. General appearance: alert and no distress Lungs: clear to auscultation bilaterally Heart: regular rate and rhythm Abdomen: soft, non-tender; bowel sounds normal Pelvic: 4/80/-2  Extremities: Homans sign is negative, no  sign of DVT Presentation: cephalic Fetal monitoringBaseline: 150 bpm, Variability: Good {> 6 bpm), Accelerations: Reactive, and Decelerations: Absent Uterine activityDate/time of onset: 0400 this am, Frequency: Every 3 minutes, Duration: 60 seconds, and Intensity: strong Dilation: 4 Effacement (%): 80 Station: -2, -3 Exam by:: [email or web address], RN   Prenatal labs: ABO, Rh: --/--/PENDING (08/10 1250) Antibody: PENDING (08/10 1250) Rubella: 1.77 (02/03 1346) RPR: Non Reactive (05/23 0830)  HBsAg: Negative (02/03 1346)  HIV: Non Reactive (05/23 0830)  GBS: Positive/-- (07/19 1432)  1 hr Glucola 113 Genetic screening normal Anatomy 12-04-1988 normal  Prenatal Transfer Tool  Maternal Diabetes: No Genetic Screening: Normal Maternal Ultrasounds/Referrals: Normal Fetal Ultrasounds or other Referrals:  None Maternal Substance Abuse:  No Significant Maternal Medications:  None Significant Maternal Lab Results: Group B Strep positive  Results for orders placed or performed during the hospital encounter of 07/13/22 (from the past 24 hour(s))  POCT fern test   Collection Time: 07/13/22 10:38 AM  Result Value Ref Range   POCT Fern Test Negative = intact amniotic membranes   CBC   Collection Time: 07/13/22 12:50 PM  Result Value Ref Range   WBC 10.1 4.0 - 10.5 K/uL   RBC 3.63 (L) 3.87 - 5.11 MIL/uL   Hemoglobin 10.6 (L) 12.0 - 15.0 g/dL   HCT 09/12/22 (L) 13.2 - 44.0 %   MCV 86.8 80.0 - 100.0 fL   MCH 29.2 26.0 - 34.0 pg   MCHC 33.7 30.0 - 36.0 g/dL   RDW 10.2 72.5 - 36.6 %   Platelets 235 150 - 400 K/uL   nRBC 0.0 0.0 - 0.2 %  Type and screen [facility]   Collection Time: 07/13/22 12:50 PM  Result Value Ref Range   ABO/RH(D) PENDING    Antibody Screen PENDING    Sample Expiration      07/16/2022,2359 Performed at [facility], [address] [place], [place]     Patient Active Problem List   Diagnosis Date Noted   Normal labor 07/13/2022    Positive testing for group B Streptococcus 06/28/2022   Supervision of normal first pregnancy 01/03/2022    Assessment/Plan:  Kathleen Schmidt is a 26 y.o. G1P0000 at [redacted]w[redacted]d here for contractions every few minutes that are 10/10 in severity.   #Labor: Expectant management   #Pain: Fentanyl, Epidural #FWB: Category 1  #ID:  GBS+, Penicillin will be given #MOF: Breast and bottle #MOC: Depo #Circ:  N/A  [redacted]w[redacted]d, Medical Student  07/13/2022, 1:22 PM   I confirm that I have verified and agree with the information documented in  the Medical Student  note.   Carlynn Herald, CNM 07/13/2022 3:58 PM

## 2022-07-13 NOTE — Progress Notes (Signed)
Kathleen Schmidt is a 26 y.o. G1P0000 at [redacted]w[redacted]d by LMP admitted for active labor  Subjective: Patient is resting well with epidural. Otherwise no complaints.    Objective: BP 132/75   Pulse 87   Temp 99.2 F (37.3 C) (Axillary)   Resp 16   Ht 5\' 3"  (1.6 m)   Wt 79.4 kg   LMP 10/08/2021 (Exact Date)   SpO2 99%   BMI 31.00 kg/m  No intake/output data recorded. No intake/output data recorded.  FHT:  FHR: 150 bpm, variability: moderate,  accelerations:  Present,  decelerations:  Absent UC:   regular, every 2-4 minutes SVE:   Dilation: 6 Effacement (%): 70 Station: -2 Exam by:: Mary [email or web address] Johnson, RNC-OB  Labs: Lab Results  Component Value Date   WBC 10.1 07/13/2022   HGB 10.6 (L) 07/13/2022   HCT 31.5 (L) 07/13/2022   MCV 86.8 07/13/2022   PLT 235 07/13/2022    Assessment / Plan: Spontaneous labor, progressing normally s/p SROM. Light Mec.   Labor: Progressing normally-Continue with expectant management.  Assess for Pit Augmentation if needed.  Fetal Wellbeing:  Category I- Continue to monitor for signs of fetal distress.  Pain Control:  Epidural I/D:   GBS Positive adequately treated  Anticipated MOD:  NSVD  09/12/2022, CNM 07/13/2022, 7:21 PM

## 2022-07-13 NOTE — Anesthesia Procedure Notes (Signed)
Epidural Patient location during procedure: OB Start time: 07/13/2022 1:40 PM End time: 07/13/2022 1:45 PM  Staffing Anesthesiologist: Bethena Midget, MD  Preanesthetic Checklist Completed: patient identified, IV checked, site marked, risks and benefits discussed, surgical consent, monitors and equipment checked, pre-op evaluation and timeout performed  Epidural Patient position: sitting Prep: DuraPrep and site prepped and draped Patient monitoring: continuous pulse ox and blood pressure Approach: midline Location: L3-L4 Injection technique: LOR air  Needle:  Needle type: Tuohy  Needle gauge: 17 G Needle length: 9 cm and 9 Needle insertion depth: 7 cm Catheter type: closed end flexible Catheter size: 19 Gauge Catheter at skin depth: 13 cm Test dose: negative  Assessment Events: blood not aspirated, injection not painful, no injection resistance, no paresthesia and negative IV test

## 2022-07-13 NOTE — MAU Note (Signed)
.  Kathleen Schmidt is a 26 y.o. at [redacted]w[redacted]d here in MAU reporting: ctx since 0400 this morning, states they are now every 2-5 minutes. Denies VB. +FM. Has been leaking clear fluid since around 0900.   Pain score: 9 Vitals:   07/13/22 1009  BP: 137/86  Pulse: 83  Resp: 14  Temp: 98.6 F (37 C)     FHT:156 Lab orders placed from triage:  mau labor

## 2022-07-14 ENCOUNTER — Encounter (HOSPITAL_COMMUNITY): Payer: Self-pay | Admitting: Family Medicine

## 2022-07-14 DIAGNOSIS — O99824 Streptococcus B carrier state complicating childbirth: Secondary | ICD-10-CM | POA: Diagnosis not present

## 2022-07-14 DIAGNOSIS — Z3A39 39 weeks gestation of pregnancy: Secondary | ICD-10-CM | POA: Diagnosis not present

## 2022-07-14 LAB — RPR: RPR Ser Ql: NONREACTIVE

## 2022-07-14 MED ORDER — TETANUS-DIPHTH-ACELL PERTUSSIS 5-2.5-18.5 LF-MCG/0.5 IM SUSY: 0.5000 mL | PREFILLED_SYRINGE | Freq: Once | INTRAMUSCULAR | Status: DC

## 2022-07-14 MED ORDER — WITCH HAZEL-GLYCERIN EX PADS: 1.0000 | MEDICATED_PAD | CUTANEOUS | Status: DC | PRN

## 2022-07-14 MED ORDER — SIMETHICONE 80 MG PO CHEW: 80.0000 mg | CHEWABLE_TABLET | ORAL | Status: DC | PRN

## 2022-07-14 MED ORDER — DIBUCAINE (PERIANAL) 1 % EX OINT: 1.0000 | TOPICAL_OINTMENT | CUTANEOUS | Status: DC | PRN

## 2022-07-14 MED ORDER — ONDANSETRON HCL 4 MG PO TABS: 4.0000 mg | ORAL_TABLET | ORAL | Status: DC | PRN

## 2022-07-14 MED ORDER — ACETAMINOPHEN 325 MG PO TABS
650.0000 mg | ORAL_TABLET | ORAL | Status: DC | PRN
  Administered 2022-07-14 – 2022-07-15 (×3): 650 mg via ORAL
  Filled 2022-07-14 (×3): qty 2

## 2022-07-14 MED ORDER — SENNOSIDES-DOCUSATE SODIUM 8.6-50 MG PO TABS
2.0000 | ORAL_TABLET | ORAL | Status: DC
  Administered 2022-07-14: 2 via ORAL
  Filled 2022-07-14: qty 2

## 2022-07-14 MED ORDER — DIPHENHYDRAMINE HCL 25 MG PO CAPS: 25.0000 mg | ORAL_CAPSULE | Freq: Four times a day (QID) | ORAL | Status: DC | PRN

## 2022-07-14 MED ORDER — COCONUT OIL OIL: 1.0000 | TOPICAL_OIL | Status: DC | PRN

## 2022-07-14 MED ORDER — ZOLPIDEM TARTRATE 5 MG PO TABS: 5.0000 mg | ORAL_TABLET | Freq: Every evening | ORAL | Status: DC | PRN

## 2022-07-14 MED ORDER — MEDROXYPROGESTERONE ACETATE 150 MG/ML IM SUSP: 150.0000 mg | Freq: Once | INTRAMUSCULAR | Status: DC

## 2022-07-14 MED ORDER — ONDANSETRON HCL 4 MG/2ML IJ SOLN: 4.0000 mg | INTRAMUSCULAR | Status: DC | PRN

## 2022-07-14 MED ORDER — PRENATAL MULTIVITAMIN CH
1.0000 | ORAL_TABLET | Freq: Every day | ORAL | Status: DC
  Administered 2022-07-14 – 2022-07-15 (×2): 1 via ORAL
  Filled 2022-07-14 (×2): qty 1

## 2022-07-14 MED ORDER — BENZOCAINE-MENTHOL 20-0.5 % EX AERO
1.0000 | INHALATION_SPRAY | CUTANEOUS | Status: DC | PRN
  Administered 2022-07-14: 1 via TOPICAL
  Filled 2022-07-14: qty 56

## 2022-07-14 MED ORDER — IBUPROFEN 600 MG PO TABS
600.0000 mg | ORAL_TABLET | Freq: Four times a day (QID) | ORAL | Status: DC
  Administered 2022-07-14 – 2022-07-15 (×5): 600 mg via ORAL
  Filled 2022-07-14 (×5): qty 1

## 2022-07-14 NOTE — Lactation Note (Signed)
This note was copied from a baby's chart. Lactation Consultation Note  Patient Name: Kathleen Schmidt HQION'G Date: 07/14/2022 Reason for consult: Initial assessment;Primapara;1st time breastfeeding;Term;Breastfeeding assistance (Baby rooting, LC offered to assist to latch, and birthing parent receptive. In the last 2 hours baby had been fed 80ml and then 45 ml of formula . MBURN reviewed w/ [name] the appropriate volumes at this age. LC reviewed supply and demand. -) Age:25 hours LC reviewed BF goals for 24 hours if mom desires to establish a good milk supply. LC encouraged mom to offer the breast 1st , if baby is still hungry after the 1st breast offer the 2nd breast. If satisfied hold off on the formula. LC enc mom to give the baby practice latching. LC praised mom for the amount of colostrum she has.  Maternal Data Has patient been taught Hand Expression?: Yes Does the patient have breastfeeding experience prior to this delivery?: No  Feeding Mother's Current Feeding Choice: Breast Milk and Formula Nipple Type: Slow - flow  LATCH Score Latch: Grasps breast easily, tongue down, lips flanged, rhythmical sucking.  Audible Swallowing: Spontaneous and intermittent  Type of Nipple: Everted at rest and after stimulation  Comfort (Breast/Nipple): Soft / non-tender  Hold (Positioning): Assistance needed to correctly position infant at breast and maintain latch.  LATCH Score: 9   Lactation Tools Discussed/Used Tools: Shells (due some areola edema at the base of the nipple.)  Interventions Interventions: Breast feeding basics reviewed;Assisted with latch;Skin to skin;Breast massage;Hand express;Reverse pressure;Breast compression;Adjust position;Support pillows;Position options;Shells;Education;LC Services brochure  Discharge WIC Program: Yes (will need a hand pump prior to D/C,)  Consult Status Consult Status: Follow-up Date: 07/14/22 Follow-up type: In-patient    Matilde Sprang Kamarii Carton 07/14/2022, 12:14 PM

## 2022-07-14 NOTE — Anesthesia Postprocedure Evaluation (Signed)
Anesthesia Post Note  Patient: Shardea Cwynar  Procedure(s) Performed: AN AD HOC LABOR EPIDURAL     Patient location during evaluation: Mother Baby Anesthesia Type: Epidural Level of consciousness: awake and alert and oriented Pain management: satisfactory to patient Vital Signs Assessment: post-procedure vital signs reviewed and stable Respiratory status: respiratory function stable Cardiovascular status: stable Postop Assessment: no headache, no backache, epidural receding, patient able to bend at knees, no signs of nausea or vomiting, adequate PO intake and able to ambulate Anesthetic complications: no   No notable events documented.  Last Vitals:  Vitals:   07/14/22 0725 07/14/22 1143  BP: 125/76 124/74  Pulse: 86 82  Resp: 18 18  Temp: 37.4 C 37.2 C  SpO2:      Last Pain:  Vitals:   07/14/22 1143  TempSrc: Oral  PainSc: 0-No pain   Pain Goal: Patients Stated Pain Goal: 0 (07/13/22 2054)                 Karleen Dolphin

## 2022-07-14 NOTE — Discharge Summary (Signed)
Postpartum Discharge Summary  Date of Service updated***     Patient Name: Kathleen Schmidt DOB: 20-Nov-1996 MRN: [id number]  Date of admission: 07/13/2022 Delivery date:07/14/2022  Delivering provider: Seabron Spates  Date of discharge: 07/14/2022  Admitting diagnosis: Normal labor [O80, Z37.9] Intrauterine pregnancy: [redacted]w[redacted]d    Secondary diagnosis:  Principal Problem:   Normal labor  Additional problems: Meconium stained amniotic fluid    Discharge diagnosis: Term Pregnancy Delivered                                              Post partum procedures:{Postpartum procedures:23558} Augmentation: AROM Complications: None  Hospital course: Onset of Labor With Vaginal Delivery      26y.o. yo G1P0000 at 25w6das admitted in Latent Labor on 07/13/2022. Patient had an uncomplicated labor course as follows:  Membrane Rupture Time/Date: 9:29 PM ,07/13/2022    She had moderate meconium stained fluid Neonatology attended delivery  Delivery Method:Vaginal, Spontaneous  Episiotomy: None  Lacerations:  1st degree;Perineal  Patient had an uncomplicated postpartum course.  She is ambulating, tolerating a regular diet, passing flatus, and urinating well. Patient is discharged home in stable condition on 07/14/22.  Newborn Data: Birth date:07/14/2022  Birth time:4:33 AM  Gender:Female  Living status:Living  Apgars:8 ,9  Weight:   Magnesium Sulfate received: No BMZ received: No Rhophylac:N/A MMR:No T-DaP:Given prenatally Flu: No Transfusion:No  Physical exam  Vitals:   07/14/22 0417 07/14/22 0445 07/14/22 0500 07/14/22 0503  BP:  133/69 (!) 92/56 133/85  Pulse:  87 (!) 131 99  Resp:  16 16 16   Temp: 99.1 F (37.3 C)     TempSrc: Oral     SpO2:      Weight:      Height:       General: {Exam; general:21111117} Lochia: {Desc; appropriate/inappropriate:30686::"appropriate"} Uterine Fundus: {Desc; firm/soft:30687} Incision: {Exam; incision:21111123} DVT Evaluation: {Exam;  dvt:2111122} Labs: Lab Results  Component Value Date   WBC 10.1 07/13/2022   HGB 10.6 (L) 07/13/2022   HCT 31.5 (L) 07/13/2022   MCV 86.8 07/13/2022   PLT 235 07/13/2022      Latest Ref Rng & Units 10/14/2016   11:07 PM  CMP  Glucose 65 - 99 mg/dL 125   BUN 6 - 20 mg/dL 10   Creatinine 0.44 - 1.00 mg/dL 0.74   Sodium 135 - 145 mmol/L 136   Potassium 3.5 - 5.1 mmol/L 3.5   Chloride 101 - 111 mmol/L 104   CO2 22 - 32 mmol/L 23   Calcium 8.9 - 10.3 mg/dL 9.5   Total Protein 6.5 - 8.1 g/dL 7.6   Total Bilirubin 0.3 - 1.2 mg/dL 0.4   Alkaline Phos 38 - 126 U/L 64   AST 15 - 41 U/L 21   ALT 14 - 54 U/L 16    Edinburgh Score:     No data to display            After visit meds:  Allergies as of 07/14/2022   No Known Allergies   Med Rec must be completed prior to using this [facility]*        Discharge home in stable condition Infant Feeding: Breast Infant Disposition:{CHL IP OB HOME WITH MONIOEVO:35009}ischarge instruction: per After Visit Summary and Postpartum booklet. Activity: Advance as tolerated. Pelvic rest for 6 weeks.  Diet: routine diet  Anticipated Birth Control: PP Depo given Postpartum Appointment:4 weeks Additional Postpartum F/U: {PP Procedure:23957} Future Appointments: Future Appointments  Date Time Provider Clark  07/18/2022  2:50 PM [facility] NURSE [facility]  07/18/2022  3:10 PM Roma Schanz, CNM [facility]   Follow up Visit:      07/14/2022 Hansel Feinstein, CNM

## 2022-07-14 NOTE — Progress Notes (Signed)
Patient ID: Kathleen Schmidt, female   DOB: 03-Jun-1996, 26 y.o.   MRN: [id number] Patient now feeling pressure  Vitals:   07/14/22 0208 07/14/22 0230 07/14/22 0300 07/14/22 0330  BP:  138/77 (!) 147/87 130/82  Pulse:  91 99 94  Resp:  16 16 16   Temp: 99.1 F (37.3 C)     TempSrc: Oral     SpO2:      Weight:      Height:       FHR stable  Cervix completely dilated now per RN  Will start pushing

## 2022-07-15 MED ORDER — IBUPROFEN 600 MG PO TABS: 600.0000 mg | ORAL_TABLET | Freq: Four times a day (QID) | ORAL | 0 refills | Status: DC

## 2022-07-15 MED ORDER — ACETAMINOPHEN 325 MG PO TABS: 650.0000 mg | ORAL_TABLET | ORAL | Status: DC | PRN

## 2022-07-15 NOTE — Plan of Care (Signed)
  Problem: Education: Goal: Knowledge of General Education information will improve Description: Including pain rating scale, medication(s)/side effects and non-pharmacologic comfort measures Outcome: Completed/Met   Problem: Health Behavior/Discharge Planning: Goal: Ability to manage health-related needs will improve Outcome: Completed/Met   Problem: Clinical Measurements: Goal: Ability to maintain clinical measurements within normal limits will improve Outcome: Completed/Met Goal: Will remain free from infection Outcome: Completed/Met Goal: Diagnostic test results will improve Outcome: Completed/Met Goal: Respiratory complications will improve Outcome: Completed/Met Goal: Cardiovascular complication will be avoided Outcome: Completed/Met   Problem: Activity: Goal: Risk for activity intolerance will decrease Outcome: Completed/Met   Problem: Nutrition: Goal: Adequate nutrition will be maintained Outcome: Completed/Met   Problem: Coping: Goal: Level of anxiety will decrease Outcome: Completed/Met   Problem: Elimination: Goal: Will not experience complications related to bowel motility Outcome: Completed/Met Goal: Will not experience complications related to urinary retention Outcome: Completed/Met   Problem: Pain Managment: Goal: General experience of comfort will improve Outcome: Completed/Met   Problem: Safety: Goal: Ability to remain free from injury will improve Outcome: Completed/Met   Problem: Skin Integrity: Goal: Risk for impaired skin integrity will decrease Outcome: Completed/Met   Problem: Education: Goal: Knowledge of condition will improve Outcome: Completed/Met   Problem: Activity: Goal: Will verbalize the importance of balancing activity with adequate rest periods Outcome: Completed/Met Goal: Ability to tolerate increased activity will improve Outcome: Completed/Met   Problem: Life Cycle: Goal: Chance of risk for complications during the  postpartum period will decrease Outcome: Completed/Met   Problem: Role Relationship: Goal: Ability to demonstrate positive interaction with newborn will improve Outcome: Completed/Met

## 2022-07-17 ENCOUNTER — Encounter: Payer: Self-pay | Admitting: Obstetrics & Gynecology

## 2022-07-18 ENCOUNTER — Other Ambulatory Visit: Payer: Medicaid Other

## 2022-07-18 ENCOUNTER — Encounter: Payer: Medicaid Other | Admitting: Women's Health

## 2022-07-25 ENCOUNTER — Telehealth (HOSPITAL_COMMUNITY): Payer: Self-pay

## 2022-07-25 NOTE — Telephone Encounter (Signed)
Patient reports doing pretty good, healing good, and back to usual self. Patient declines questions/concerns about her health and healing.  Patient reports that baby is pretty good. Eating well, peeing/pooping, and gaining weight well. "Baby spits up some after feedings. I am pumping and bottle feeding". RN reviewed paced bottle feeding and burping baby. RN encouraged patient to reach out to her pediatrician about concerns with spitting up. Baby sleeps in a bassinet. RN reviewed ABC's of safe sleep with patient. Patient declines any questions or concerns about baby.  EPDS score is 2.  Marcelino Duster [facility]  07/25/22,1442

## 2022-08-31 ENCOUNTER — Ambulatory Visit: Payer: Medicaid Other | Admitting: Advanced Practice Midwife

## 2022-09-06 ENCOUNTER — Ambulatory Visit (INDEPENDENT_AMBULATORY_CARE_PROVIDER_SITE_OTHER): Payer: Medicaid Other | Admitting: Advanced Practice Midwife

## 2022-09-06 ENCOUNTER — Encounter: Payer: Self-pay | Admitting: Advanced Practice Midwife

## 2022-09-06 MED ORDER — MEDROXYPROGESTERONE ACETATE 150 MG/ML IM SUSP: 150.0000 mg | INTRAMUSCULAR | 3 refills | Status: DC

## 2022-09-06 NOTE — Progress Notes (Signed)
POSTPARTUM VISIT Patient name: [name] MRN [id number]  Date of birth: [birth year] Chief Complaint:   Postpartum Care (Want depo, had sex 09-02-22)  History of Present Illness:   Kathleen Schmidt is a 26 y.o. G73P1001 Hispanic female being seen today for a postpartum visit. She is 8 weeks postpartum following a spontaneous vaginal delivery at 39.6 gestational weeks. IOL: no, for n/a. Anesthesia: epidural.  Laceration: 1st deg perineal; periclitoral.  Complications: none. Inpatient contraception: no.   Pregnancy uncomplicated. Tobacco use: former . Substance use disorder: no. Last pap smear: March 2023 and results were NILM w/ HRHPV not done. Next pap smear due: March 2026 Patient's last menstrual period was 08/28/2022.  Postpartum course has been uncomplicated. Bleeding none. Bowel function is normal. Bladder function is normal. Urinary incontinence? no, fecal incontinence? no Patient is sexually active. Last sexual activity:  Sept 30 . Desired contraception: Depo. Patient does want a pregnancy in the future.  Desired family size is unsure number of children.   Upstream - 09/06/22 1444       Pregnancy Intention Screening   Does the patient want to become pregnant in the next year? No    Does the patient's partner want to become pregnant in the next year? No    Would the patient like to discuss contraceptive options today? Yes      Contraception Wrap Up   Current Method Female Condom    End Method Female Condom    Contraception Counseling Provided Yes            The pregnancy intention screening data noted above was reviewed. Potential methods of contraception were discussed. The patient elected to proceed with Female Condom.  Edinburgh Postpartum Depression Screening: negative  Edinburgh Postnatal Depression Scale - 09/06/22 1440       Edinburgh Postnatal Depression Scale:  In the Past 7 Days   I have been able to laugh and see the funny side of things. 0    I have looked forward  with enjoyment to things. 0    I have blamed myself unnecessarily when things went wrong. 0    I have been anxious or worried for no good reason. 0    I have felt scared or panicky for no good reason. 0    Things have been getting on top of me. 0    I have been so unhappy that I have had difficulty sleeping. 0    I have felt sad or miserable. 0    I have been so unhappy that I have been crying. 0    The thought of harming myself has occurred to me. 0    Edinburgh Postnatal Depression Scale Total 0                04/28/2021   11:59 AM  GAD 7 : Generalized Anxiety Score  Nervous, Anxious, on Edge 0  Control/stop worrying 0  Worry too much - different things 0  Trouble relaxing 0  Restless 0  Easily annoyed or irritable 0  Afraid - awful might happen 0  Total GAD 7 Score 0     Baby's course has been uncomplicated. Baby is feeding by bottle. Infant has a pediatrician/family doctor? Yes.  Childcare strategy if returning to work/school: n/a-stay at home mom.  Pt has material needs met for her and baby: Yes.   Review of Systems:   Pertinent items are noted in HPI Denies Abnormal vaginal discharge w/ itching/odor/irritation, headaches, visual changes, shortness of  breath, chest pain, abdominal pain, severe nausea/vomiting, or problems with urination or bowel movements. Pertinent History Reviewed:  Reviewed past medical,surgical, obstetrical and family history.  Reviewed problem list, medications and allergies. OB History  Gravida Para Term Preterm AB Living  1 1 1  0 0 1  SAB IAB Ectopic Multiple Live Births  0 0 0 0 1    # Outcome Date GA Lbr Len/2nd Weight Sex Delivery Anes PTL Lv  1 Term 07/14/22 [redacted]w[redacted]d 7 lb 8.3 oz (3.41 kg) F Vag-Spont EPI  LIV     Birth Comments: none   Physical Assessment:   Vitals:   09/06/22 1435  BP: 107/64  Pulse: 64  Weight: 151 lb 6.4 oz (68.7 kg)  Height: 5' 3"  (1.6 m)  Body mass index is 26.82 kg/m.       Physical Examination:   General  appearance: alert, well appearing, and in no distress  Mental status: alert, oriented to person, place, and time  Skin: warm & dry   Cardiovascular: normal heart rate noted   Respiratory: normal respiratory effort, no distress   Breasts: deferred, no complaints   Abdomen: soft, non-tender   Pelvic: normal external genitalia, vulva, vagina, cervix, uterus and adnexa. Thin prep pap obtained: No  Rectal: not examined  Extremities: Edema: none         No results found for this or any previous visit (from the past 24 hour(s)).  Assessment & Plan:  1) Postpartum exam 2) Eight wks s/p spontaneous vaginal delivery 3) bottle feeding 4) Depression screening: negative 5) Contraception management: no sex until appt 10/13, bring rx for Depo in  Essential components of care per ACOG recommendations:  1.  Mood and well being:  If positive depression screen, discussed and plan developed.  If using tobacco we discussed reduction/cessation and risk of relapse If current substance abuse, we discussed and referral to local resources was offered.   2. Infant care and feeding:  If breastfeeding, discussed returning to work, pumping, breastfeeding-associated pain, guidance regarding return to fertility while lactating if not using another method. If needed, patient was provided with a letter to be allowed to pump q 2-3hrs to support lactation in a private location with access to a refrigerator to store breastmilk.   Recommended that all caregivers be immunized for flu, pertussis and other preventable communicable diseases If pt does not have material needs met for her/baby, referred to local resources for help obtaining these.  3. Sexuality, contraception and birth spacing Provided guidance regarding sexuality, management of dyspareunia, and resumption of intercourse Discussed avoiding interpregnancy interval <612ms and recommended birth spacing of 18 months  4. Sleep and fatigue Discussed coping  options for fatigue and sleep disruption Encouraged family/partner/community support of 4 hrs of uninterrupted sleep to help with mood and fatigue  5. Physical recovery  If pt had a C/S, assessed incisional pain and providing guidance on normal vs prolonged recovery If pt had a laceration, perineal healing and pain reviewed.  If urinary or fecal incontinence, discussed management and referred to PT or uro/gyn if indicated  Patient is safe to resume physical activity. Discussed attainment of healthy weight.  6.  Chronic disease management Discussed pregnancy complications if any, and their implications for future childbearing and long-term maternal health. Review recommendations for prevention of recurrent pregnancy complications, such as 17 hydroxyprogesterone caproate to reduce risk for recurrent PTB not applicable, or aspirin to reduce risk of preeclampsia not applicable. Pt had GDM: no. If yes, 2hr GTT  scheduled: not applicable. Reviewed medications and non-pregnant dosing including consideration of whether pt is breastfeeding using a reliable resource such as LactMed: not applicable Referred for f/u w/ PCP or subspecialist providers as indicated: not applicable  7. Health maintenance Mammogram at 25yo or earlier if indicated Pap smears as indicated  Meds:  Meds ordered this encounter  Medications   medroxyPROGESTERone (DEPO-PROVERA) 150 MG/ML injection    Sig: Inject 1 mL (150 mg total) into the muscle every 3 (three) months.    Dispense:  1 mL    Refill:  3    Order Specific Question:   Supervising Provider    Answer:   Janyth Pupa [[id number]]    Follow-up: Return for 10/13 RN visit for preg test/Depo; depo q 3 months; physical 1 year.   No orders of the defined types were placed in this encounter.   Myrtis Ser CNM 09/06/2022 3:12 PM

## 2022-09-18 ENCOUNTER — Telehealth: Payer: Medicaid Other | Admitting: Family Medicine

## 2022-09-18 DIAGNOSIS — B3731 Acute candidiasis of vulva and vagina: Secondary | ICD-10-CM

## 2022-09-19 MED ORDER — FLUCONAZOLE 150 MG PO TABS: 150.0000 mg | ORAL_TABLET | Freq: Once | ORAL | 0 refills | Status: AC

## 2022-09-19 NOTE — Progress Notes (Signed)

## 2022-09-25 ENCOUNTER — Ambulatory Visit (INDEPENDENT_AMBULATORY_CARE_PROVIDER_SITE_OTHER): Payer: Medicaid Other | Admitting: *Deleted

## 2022-09-25 DIAGNOSIS — Z3042 Encounter for surveillance of injectable contraceptive: Secondary | ICD-10-CM

## 2022-09-25 DIAGNOSIS — Z3202 Encounter for pregnancy test, result negative: Secondary | ICD-10-CM | POA: Diagnosis not present

## 2022-09-25 LAB — POCT URINE PREGNANCY: Preg Test, Ur: NEGATIVE

## 2022-09-25 MED ORDER — MEDROXYPROGESTERONE ACETATE 150 MG/ML IM SUSP
150.0000 mg | Freq: Once | INTRAMUSCULAR | Status: AC
Start: 2022-09-25 — End: 2022-09-25
  Administered 2022-09-25: 150 mg via INTRAMUSCULAR

## 2022-09-25 NOTE — Progress Notes (Signed)
   NURSE VISIT- INJECTION  SUBJECTIVE:  Kathleen Schmidt is a 26 y.o. G26P1001 female here for a Depo Provera for contraception/period management. She is a GYN patient.   OBJECTIVE:  LMP 08/28/2022   Appears well, in no apparent distress  Injection administered in: Right upper quad. gluteus  Meds ordered this encounter  Medications   medroxyPROGESTERone (DEPO-PROVERA) injection 150 mg    ASSESSMENT: GYN patient Depo Provera for contraception/period management PLAN: Follow-up: in 11-13 weeks for next Depo   Kathleen Schmidt  09/25/2022 10:56 AM

## 2022-10-28 ENCOUNTER — Telehealth: Payer: Medicaid Other | Admitting: Urgent Care

## 2022-10-28 DIAGNOSIS — N760 Acute vaginitis: Secondary | ICD-10-CM

## 2022-10-28 MED ORDER — FLUCONAZOLE 150 MG PO TABS: 150.0000 mg | ORAL_TABLET | Freq: Once | ORAL | 0 refills | Status: AC

## 2022-10-28 NOTE — Progress Notes (Signed)
E-Visit for Vaginal Symptoms  We are sorry that you are not feeling well. Here is how we plan to help! Based on what you shared with me it looks like you: May have a yeast vaginosis  Vaginosis is an inflammation of the vagina that can result in discharge, itching and pain. The cause is usually a change in the normal balance of vaginal bacteria or an infection. Vaginosis can also result from reduced estrogen levels after menopause.  The most common causes of vaginosis are:   Bacterial vaginosis which results from an overgrowth of one on several organisms that are normally present in your vagina.   Yeast infections which are caused by a naturally occurring fungus called candida.   Vaginal atrophy (atrophic vaginosis) which results from the thinning of the vagina from reduced estrogen levels after menopause.   Trichomoniasis which is caused by a parasite and is commonly transmitted by sexual intercourse.  Factors that increase your risk of developing vaginosis include: Medications, such as antibiotics and steroids Uncontrolled diabetes Use of hygiene products such as bubble bath, vaginal spray or vaginal deodorant Douching Wearing damp or tight-fitting clothing Using an intrauterine device (IUD) for birth control Hormonal changes, such as those associated with pregnancy, birth control pills or menopause Sexual activity Having a sexually transmitted infection  Your treatment plan is A single Diflucan (fluconazole) 150mg  tablet once.  I have electronically sent this prescription into the pharmacy that you have chosen.  Be sure to take all of the medication as directed. Stop taking any medication if you develop a rash, tongue swelling or shortness of breath. Mothers who are breast feeding should consider pumping and discarding their breast milk while on these antibiotics. However, there is no consensus that infant exposure at these doses would be harmful.  Remember that medication creams can  weaken latex condoms.  BECAUSE YOUR SYMPTOMS CLEARED UP WITH DIFLUCAN IN THE PAST, WE WILL TRY THIS AGAIN. HOWEVER, IF YOU HAVE A RECURRENCE OF YOUR SYMPTOMS WITHIN THE NEXT FEW WEEKS, THIS WILL REQUIRE AN IN PERSON, FACE TO FACE ENCOUNTER FOR A VAGINAL SWAB TO ENSURE THERE IS NO SECONDARY BACTERIAL INFECTION.   HOME CARE:  Good hygiene may prevent some types of vaginosis from recurring and may relieve some symptoms:  Avoid baths, hot tubs and whirlpool spas. Rinse soap from your outer genital area after a shower, and dry the area well to prevent irritation. Don't use scented or harsh soaps, such as those with deodorant or antibacterial action. Avoid irritants. These include scented tampons and pads. Wipe from front to back after using the toilet. Doing so avoids spreading fecal bacteria to your vagina.  Other things that may help prevent vaginosis include:  Don't douche. Your vagina doesn't require cleansing other than normal bathing. Repetitive douching disrupts the normal organisms that reside in the vagina and can actually increase your risk of vaginal infection. Douching won't clear up a vaginal infection. Use a latex condom. Both female and female latex condoms may help you avoid infections spread by sexual contact. Wear cotton underwear. Also wear pantyhose with a cotton crotch. If you feel comfortable without it, skip wearing underwear to bed. Yeast thrives in [place] Your symptoms should improve in the next day or two.  GET HELP RIGHT AWAY IF:  You have pain in your lower abdomen ( pelvic area or over your ovaries) You develop nausea or vomiting You develop a fever Your discharge changes or worsens You have persistent pain with intercourse You  develop shortness of breath, a rapid pulse, or you faint.  These symptoms could be signs of problems or infections that need to be evaluated by a medical provider now.  MAKE SURE YOU   Understand these instructions. Will  watch your condition. Will get help right away if you are not doing well or get worse.  Thank you for choosing an e-visit.  Your e-visit answers were reviewed by a board certified advanced clinical practitioner to complete your personal care plan. Depending upon the condition, your plan could have included both over the counter or prescription medications.  Please review your pharmacy choice. Make sure the pharmacy is open so you can pick up prescription now. If there is a problem, you may contact your provider through [employer] and have the prescription routed to another pharmacy.  Your safety is important to [place]. If you have drug allergies check your prescription carefully.   For the next 24 hours you can use MyChart to ask questions about today's visit, request a non-urgent call back, or ask for a work or school excuse. You will get an email in the next two days asking about your experience. I hope that your e-visit has been valuable and will speed your recovery.   I have spent 5 minutes in review of e-visit questionnaire, review and updating patient chart, medical decision making and response to patient.   Rochella Benner L Perfecto Purdy, PA

## 2022-11-29 ENCOUNTER — Ambulatory Visit: Payer: Medicaid Other

## 2022-12-18 ENCOUNTER — Ambulatory Visit: Payer: Medicaid Other

## 2022-12-20 ENCOUNTER — Ambulatory Visit (INDEPENDENT_AMBULATORY_CARE_PROVIDER_SITE_OTHER): Payer: Medicaid Other | Admitting: *Deleted

## 2022-12-20 DIAGNOSIS — Z3042 Encounter for surveillance of injectable contraceptive: Secondary | ICD-10-CM

## 2022-12-20 MED ORDER — MEDROXYPROGESTERONE ACETATE 150 MG/ML IM SUSP
150.0000 mg | Freq: Once | INTRAMUSCULAR | Status: AC
  Administered 2022-12-20: 150 mg via INTRAMUSCULAR

## 2022-12-20 NOTE — Progress Notes (Signed)
   NURSE VISIT- INJECTION  SUBJECTIVE:  Kathleen Schmidt is a 27 y.o. G27P1001 female here for a Depo Provera for contraception/period management. She is a GYN patient.   OBJECTIVE:  There were no vitals taken for this visit.  Appears well, in no apparent distress  Injection administered in: Left upper quad. gluteus  Meds ordered this encounter  Medications   medroxyPROGESTERone (DEPO-PROVERA) injection 150 mg    ASSESSMENT: GYN patient Depo Provera for contraception/period management PLAN: Follow-up: in 11-13 weeks for next Depo   Levy Pupa  12/20/2022 2:04 PM

## 2023-01-17 ENCOUNTER — Other Ambulatory Visit (INDEPENDENT_AMBULATORY_CARE_PROVIDER_SITE_OTHER): Payer: Medicaid Other

## 2023-01-17 ENCOUNTER — Other Ambulatory Visit (HOSPITAL_COMMUNITY)
Admission: RE | Admit: 2023-01-17 | Discharge: 2023-01-17 | Disposition: A | Discharge status: Home / Self Care | Payer: Medicaid Other | Source: Ambulatory Visit | Attending: Obstetrics & Gynecology | Admitting: Obstetrics & Gynecology

## 2023-01-17 DIAGNOSIS — N898 Other specified noninflammatory disorders of vagina: Secondary | ICD-10-CM | POA: Insufficient documentation

## 2023-01-17 NOTE — Progress Notes (Signed)
   NURSE VISIT- VAGINITIS/STD/POC  SUBJECTIVE:  Kathleen Schmidt is a 27 y.o. G1P1001 GYN patientfemale here for a vaginal swab for vaginitis screening.  She reports the following symptoms: discharge described as white and odor for 1 week. Denies abnormal vaginal bleeding, significant pelvic pain, fever, or UTI symptoms.  OBJECTIVE:  There were no vitals taken for this visit.  Appears well, in no apparent distress  ASSESSMENT: Vaginal swab for vaginitis screening  PLAN: Self-collected vaginal probe for Gonorrhea, Chlamydia, Trichomonas, Bacterial Vaginosis, Yeast sent to lab Treatment: to be determined once results are received Follow-up as needed if symptoms persist/worsen, or new symptoms develop  Alice Rieger  01/17/2023 3:57 PM

## 2023-01-19 LAB — CERVICOVAGINAL ANCILLARY ONLY
Bacterial Vaginitis (gardnerella): POSITIVE — AB
Candida Glabrata: NEGATIVE
Candida Vaginitis: NEGATIVE
Chlamydia: NEGATIVE
Comment: NEGATIVE
Comment: NEGATIVE
Comment: NEGATIVE
Comment: NEGATIVE
Comment: NEGATIVE
Comment: NORMAL
Neisseria Gonorrhea: NEGATIVE
Trichomonas: NEGATIVE

## 2023-01-20 ENCOUNTER — Encounter: Payer: Self-pay | Admitting: Obstetrics & Gynecology

## 2023-01-22 ENCOUNTER — Other Ambulatory Visit: Payer: Self-pay | Admitting: Adult Health

## 2023-01-22 MED ORDER — METRONIDAZOLE 500 MG PO TABS: 500.0000 mg | ORAL_TABLET | Freq: Two times a day (BID) | ORAL | 0 refills | Status: DC

## 2023-01-22 NOTE — Progress Notes (Signed)
+  BV on vaginal swab will rx flagyl,no sex or alcohol while taking  ?

## 2023-03-14 ENCOUNTER — Ambulatory Visit: Payer: Medicaid Other

## 2023-03-16 ENCOUNTER — Ambulatory Visit (INDEPENDENT_AMBULATORY_CARE_PROVIDER_SITE_OTHER): Payer: Medicaid Other | Admitting: *Deleted

## 2023-03-16 DIAGNOSIS — Z3042 Encounter for surveillance of injectable contraceptive: Secondary | ICD-10-CM | POA: Diagnosis not present

## 2023-03-16 MED ORDER — MEDROXYPROGESTERONE ACETATE 150 MG/ML IM SUSY
150.0000 mg | PREFILLED_SYRINGE | Freq: Once | INTRAMUSCULAR | Status: AC
  Administered 2023-03-16: 150 mg via INTRAMUSCULAR

## 2023-03-16 NOTE — Progress Notes (Signed)
   NURSE VISIT- INJECTION  SUBJECTIVE:  Kathleen Schmidt is a 27 y.o. G73P1001 female here for a Depo Provera for contraception/period management. She is a GYN patient.   OBJECTIVE:  There were no vitals taken for this visit.  Appears well, in no apparent distress  Injection administered in: Right upper quad. gluteus  Meds ordered this encounter  Medications   medroxyPROGESTERone Acetate SUSY 150 mg    ASSESSMENT: GYN patient Depo Provera for contraception/period management PLAN: Follow-up: in 11-13 weeks for next Depo   Jobe Marker  03/16/2023 12:03 PM

## 2023-06-08 ENCOUNTER — Ambulatory Visit: Payer: Medicaid Other

## 2023-06-14 ENCOUNTER — Ambulatory Visit (INDEPENDENT_AMBULATORY_CARE_PROVIDER_SITE_OTHER): Payer: Medicaid Other | Admitting: *Deleted

## 2023-06-14 DIAGNOSIS — Z3042 Encounter for surveillance of injectable contraceptive: Secondary | ICD-10-CM

## 2023-06-14 MED ORDER — MEDROXYPROGESTERONE ACETATE 150 MG/ML IM SUSY
150.0000 mg | PREFILLED_SYRINGE | Freq: Once | INTRAMUSCULAR | Status: AC
  Administered 2023-06-14: 150 mg via INTRAMUSCULAR

## 2023-06-14 NOTE — Progress Notes (Signed)
   NURSE VISIT- INJECTION  SUBJECTIVE:  Kathleen Schmidt is a 27 y.o. G36P1001 female here for a Depo Provera for contraception/period management. She is a GYN patient.   OBJECTIVE:  There were no vitals taken for this visit.  Appears well, in no apparent distress  Injection administered in: Left upper quad. gluteus  Meds ordered this encounter  Medications   medroxyPROGESTERone Acetate SUSY 150 mg    ASSESSMENT: GYN patient Depo Provera for contraception/period management PLAN: Follow-up: in 11-13 weeks for next Depo   Malachy Mood  06/14/2023 2:26 PM

## 2023-09-06 ENCOUNTER — Ambulatory Visit: Payer: Medicaid Other

## 2023-10-22 ENCOUNTER — Other Ambulatory Visit: Payer: Self-pay | Admitting: Advanced Practice Midwife

## 2023-10-23 ENCOUNTER — Ambulatory Visit: Payer: Medicaid Other

## 2023-10-29 ENCOUNTER — Ambulatory Visit: Payer: Medicaid Other | Admitting: *Deleted

## 2023-10-29 DIAGNOSIS — Z3042 Encounter for surveillance of injectable contraceptive: Secondary | ICD-10-CM | POA: Diagnosis not present

## 2023-10-29 DIAGNOSIS — Z3202 Encounter for pregnancy test, result negative: Secondary | ICD-10-CM | POA: Diagnosis not present

## 2023-10-29 LAB — POCT URINE PREGNANCY: Preg Test, Ur: NEGATIVE

## 2023-10-29 MED ORDER — MEDROXYPROGESTERONE ACETATE 150 MG/ML IM SUSY
150.0000 mg | PREFILLED_SYRINGE | Freq: Once | INTRAMUSCULAR | Status: AC
  Administered 2023-10-29: 150 mg via INTRAMUSCULAR

## 2023-10-29 NOTE — Progress Notes (Addendum)
   NURSE VISIT- INJECTION  SUBJECTIVE:  Kathleen Schmidt is a 27 y.o. G87P1001 female here for a Depo Provera for contraception/period management. She is a GYN patient. UPT negative in office.   OBJECTIVE:  There were no vitals taken for this visit.  Appears well, in no apparent distress  Injection administered in: Right upper quad. gluteus  Meds ordered this encounter  Medications   medroxyPROGESTERone Acetate SUSY 150 mg    ASSESSMENT: GYN patient Depo Provera for contraception/period management PLAN: Follow-up: in 11-13 weeks for next Depo   Malachy Mood  10/29/2023 2:33 PM   Chart reviewed for nurse visit. Agree with plan of care.  Jacklyn Shell, [place] 10/29/2023 3:51 PM

## 2023-10-29 NOTE — Progress Notes (Deleted)
ft

## 2023-10-29 NOTE — Progress Notes (Deleted)
ft 

## 2023-11-07 ENCOUNTER — Telehealth: Payer: Medicaid Other | Admitting: Physician Assistant

## 2023-11-07 DIAGNOSIS — B9689 Other specified bacterial agents as the cause of diseases classified elsewhere: Secondary | ICD-10-CM | POA: Diagnosis not present

## 2023-11-07 DIAGNOSIS — N760 Acute vaginitis: Secondary | ICD-10-CM | POA: Diagnosis not present

## 2023-11-07 MED ORDER — METRONIDAZOLE 500 MG PO TABS: 500.0000 mg | ORAL_TABLET | Freq: Two times a day (BID) | ORAL | 0 refills | Status: DC

## 2023-11-07 NOTE — Progress Notes (Signed)
Message sent to patient requesting further input regarding current symptoms. Awaiting patient response.  

## 2023-11-07 NOTE — Progress Notes (Signed)
I have spent 5 minutes in review of e-visit questionnaire, review and updating patient chart, medical decision making and response to patient.   Mia Milan Cody Jacklynn Dehaas, PA-C    

## 2023-11-07 NOTE — Progress Notes (Signed)

## 2024-01-21 ENCOUNTER — Ambulatory Visit: Payer: Medicaid Other

## 2024-02-05 ENCOUNTER — Ambulatory Visit: Payer: Medicaid Other | Admitting: *Deleted

## 2024-02-05 DIAGNOSIS — Z3042 Encounter for surveillance of injectable contraceptive: Secondary | ICD-10-CM

## 2024-02-05 DIAGNOSIS — Z3202 Encounter for pregnancy test, result negative: Secondary | ICD-10-CM

## 2024-02-05 LAB — POCT URINE PREGNANCY: Preg Test, Ur: NEGATIVE

## 2024-02-05 MED ORDER — MEDROXYPROGESTERONE ACETATE 150 MG/ML IM SUSP
150.0000 mg | Freq: Once | INTRAMUSCULAR | Status: AC
  Administered 2024-02-05: 150 mg via INTRAMUSCULAR

## 2024-02-05 NOTE — Progress Notes (Signed)
   NURSE VISIT- INJECTION  SUBJECTIVE:  Kathleen Schmidt is a 28 y.o. G58P1001 female here for a Depo Provera for contraception/period management. She is a GYN patient. UPT is negative.   OBJECTIVE:  There were no vitals taken for this visit.  Appears well, in no apparent distress  Injection administered in: Left upper quad. gluteus  Meds ordered this encounter  Medications   medroxyPROGESTERone (DEPO-PROVERA) injection 150 mg    ASSESSMENT: GYN patient Depo Provera for contraception/period management PLAN: Follow-up: in 11-13 weeks for next Depo   Annamarie Dawley  02/05/2024 3:11 PM

## 2024-02-25 ENCOUNTER — Ambulatory Visit (INDEPENDENT_AMBULATORY_CARE_PROVIDER_SITE_OTHER): Payer: Self-pay | Admitting: Internal Medicine

## 2024-02-25 ENCOUNTER — Encounter: Payer: Self-pay | Admitting: Internal Medicine

## 2024-02-25 VITALS — BP 111/74 | HR 71 | Ht 62.0 in | Wt 157.0 lb

## 2024-02-25 DIAGNOSIS — G43019 Migraine without aura, intractable, without status migrainosus: Secondary | ICD-10-CM | POA: Insufficient documentation

## 2024-02-25 DIAGNOSIS — K219 Gastro-esophageal reflux disease without esophagitis: Secondary | ICD-10-CM | POA: Insufficient documentation

## 2024-02-25 MED ORDER — OMEPRAZOLE 20 MG PO CPDR
20.0000 mg | DELAYED_RELEASE_CAPSULE | Freq: Every day | ORAL | 3 refills | Status: DC
Start: 2024-02-25 — End: 2024-08-06

## 2024-02-25 MED ORDER — SUMATRIPTAN SUCCINATE 100 MG PO TABS
100.0000 mg | ORAL_TABLET | ORAL | 2 refills | Status: DC | PRN
Start: 2024-02-25 — End: 2024-08-06

## 2024-02-25 NOTE — Patient Instructions (Addendum)
 Please start taking Omeprazole as prescribed for 2 weeks and then as needed for acid reflux/upper abdomina pain.  Please avoid hot, spicy food.  Please take Excedrin migraine for mild-moderate headache. Please take Sumatriptan as needed for migraine. Okay to repeat dose after 2 hours for persistent headache. Maximum 2 doses in a day.

## 2024-02-25 NOTE — Progress Notes (Unsigned)
 New Patient Office Visit  Subjective:  Patient ID: Kathleen Schmidt, female    DOB: 02-15-1996  Age: 28 y.o. MRN: [id number]  CC:  Chief Complaint  Patient presents with   Establish Care    New pt establishing care, has abdominal concerns, upper stomach pain ongoing for about 2 years, also reports sx of migraines on and off since December.     HPI Kathleen Schmidt is a 28 y.o. female with past medical history of *** presents for establishing care.  Past Medical History:  Diagnosis Date   Medical history non-contributory     Past Surgical History:  Procedure Laterality Date   NO PAST SURGERIES      Family History  Problem Relation Age of Onset   Kidney failure Mother    Hypertension Mother    Diabetes Mother    Hypercholesterolemia Mother    Diabetes Maternal Grandmother    Stroke Maternal Grandfather     Social History   Socioeconomic History   Marital status: Significant Other    Spouse name: Not on file   Number of children: Not on file   Years of education: Not on file   Highest education level: 12th grade  Occupational History   Not on file  Tobacco Use   Smoking status: Former    Current packs/day: 0.00    Types: Cigarettes    Quit date: 2019    Years since quitting: 6.2   Smokeless tobacco: Never  Vaping Use   Vaping status: Never Used  Substance and Sexual Activity   Alcohol use: No   Drug use: No   Sexual activity: Yes    Birth control/protection: Condom  Other Topics Concern   Not on file  Social History Narrative   Not on file   Social Drivers of Health   Financial Resource Strain: Low Risk  (02/24/2024)   Overall Financial Resource Strain (CARDIA)    Difficulty of Paying Living Expenses: Not very hard  Food Insecurity: No Food Insecurity (02/24/2024)   Hunger Vital Sign    Worried About Running Out of Food in the Last Year: Never true    Ran Out of Food in the Last Year: Never true  Transportation Needs: No Transportation Needs  (02/24/2024)   PRAPARE - Administrator, Civil Service (Medical): No    Lack of Transportation (Non-Medical): No  Physical Activity: Insufficiently Active (02/24/2024)   Exercise Vital Sign    Days of Exercise per Week: 1 day    Minutes of Exercise per Session: 60 min  Stress: No Stress Concern Present (02/24/2024)   [employer] of Occupational Health - Occupational Stress Questionnaire    Feeling of Stress : Not at all  Social Connections: Moderately Integrated (02/24/2024)   Social Connection and Isolation Panel [NHANES]    Frequency of Communication with Friends and Family: Three times a week    Frequency of Social Gatherings with Friends and Family: Once a week    Attends Religious Services: More than 4 times per year    Active Member of [employer] or Organizations: No    Attends Banker Meetings: Not on file    Marital Status: Living with partner  Intimate Partner Violence: Not At Risk (09/06/2022)   Humiliation, Afraid, Rape, and Kick questionnaire    Fear of Current or Ex-Partner: No    Emotionally Abused: No    Physically Abused: No    Sexually Abused: No    ROS Review of Systems  Objective:   Today's Vitals: BP 111/74   Pulse 71   Ht 5\' 2"  (1.575 m)   Wt 157 lb (71.2 kg)   SpO2 96%   BMI 28.72 kg/m   Physical Exam  Assessment & Plan:   Problem List Items Addressed This Visit   None   Outpatient Encounter Medications as of 02/25/2024  Medication Sig   acetaminophen (TYLENOL) 325 MG tablet Take 2 tablets (650 mg total) by mouth every 4 (four) hours as needed (for pain scale < 4).   medroxyPROGESTERone Acetate 150 MG/ML SUSY INJECT 1 ML (150 MG TOTAL) INTO THE MUSCLE EVERY 3 MONTHS   metroNIDAZOLE (FLAGYL) 500 MG tablet Take 1 tablet (500 mg total) by mouth 2 (two) times daily.   No facility-administered encounter medications on file as of 02/25/2024.    Follow-up: No follow-ups on file.   Anabel Halon, MD

## 2024-02-28 NOTE — Addendum Note (Signed)
 Addended byTrena Platt on: 02/28/2024 08:15 AM   Modules accepted: Level of Service

## 2024-02-28 NOTE — Assessment & Plan Note (Signed)
 Her headache description is suggestive of migraine Can take Tylenol or Excedrin Migraine for mild to moderate headache Sumatriptan as needed for persistent migraine, can repeat dose after 2 hours If more frequent episodes, will need maintenance therapy

## 2024-02-28 NOTE — Assessment & Plan Note (Signed)
 Epigastric discomfort and burning likely due to GERD Avoid hot and spicy food Started omeprazole 20 mg QD for now

## 2024-02-29 DIAGNOSIS — G43019 Migraine without aura, intractable, without status migrainosus: Secondary | ICD-10-CM | POA: Diagnosis not present

## 2024-03-01 LAB — CBC WITH DIFFERENTIAL/PLATELET
Basophils Absolute: 0 10*3/uL (ref 0.0–0.2)
Basos: 0 %
EOS (ABSOLUTE): 0.1 10*3/uL (ref 0.0–0.4)
Eos: 2 %
Hematocrit: 39.2 % (ref 34.0–46.6)
Hemoglobin: 12.7 g/dL (ref 11.1–15.9)
Immature Grans (Abs): 0 10*3/uL (ref 0.0–0.1)
Immature Granulocytes: 0 %
Lymphocytes Absolute: 3.1 10*3/uL (ref 0.7–3.1)
Lymphs: 39 %
MCH: 31.1 pg (ref 26.6–33.0)
MCHC: 32.4 g/dL (ref 31.5–35.7)
MCV: 96 fL (ref 79–97)
Monocytes Absolute: 0.5 10*3/uL (ref 0.1–0.9)
Monocytes: 6 %
Neutrophils Absolute: 4.3 10*3/uL (ref 1.4–7.0)
Neutrophils: 53 %
Platelets: 382 10*3/uL (ref 150–450)
RBC: 4.09 x10E6/uL (ref 3.77–5.28)
RDW: 12.2 % (ref 11.7–15.4)
WBC: 8.1 10*3/uL (ref 3.4–10.8)

## 2024-03-01 LAB — CMP14+EGFR
ALT: 20 IU/L (ref 0–32)
AST: 19 IU/L (ref 0–40)
Albumin: 4.6 g/dL (ref 4.0–5.0)
Alkaline Phosphatase: 93 IU/L (ref 44–121)
BUN/Creatinine Ratio: 19 (ref 9–23)
BUN: 14 mg/dL (ref 6–20)
Bilirubin Total: 0.4 mg/dL (ref 0.0–1.2)
CO2: 19 mmol/L — ABNORMAL LOW (ref 20–29)
Calcium: 9.7 mg/dL (ref 8.7–10.2)
Chloride: 107 mmol/L — ABNORMAL HIGH (ref 96–106)
Creatinine, Ser: 0.72 mg/dL (ref 0.57–1.00)
Globulin, Total: 2.7 g/dL (ref 1.5–4.5)
Glucose: 103 mg/dL — ABNORMAL HIGH (ref 70–99)
Potassium: 4.4 mmol/L (ref 3.5–5.2)
Sodium: 141 mmol/L (ref 134–144)
Total Protein: 7.3 g/dL (ref 6.0–8.5)
eGFR: 118 mL/min/{1.73_m2} (ref >59)

## 2024-03-01 LAB — TSH: TSH: 1.42 u[IU]/mL (ref 0.450–4.500)

## 2024-03-05 ENCOUNTER — Encounter: Payer: Self-pay | Admitting: Internal Medicine

## 2024-03-09 ENCOUNTER — Telehealth: Admitting: Family

## 2024-03-09 DIAGNOSIS — N898 Other specified noninflammatory disorders of vagina: Secondary | ICD-10-CM

## 2024-03-09 NOTE — Progress Notes (Signed)
  Because of your vaginal discharge, I feel your condition warrants further evaluation and I recommend that you be seen in a face-to-face visit to rule out a more serious infection.    NOTE: There will be NO CHARGE for this E-Visit   If you are having a true medical emergency, please call 911.     For an urgent face to face visit, [place] has multiple urgent care centers for your convenience.  Click the link below for the full list of locations and hours, walk-in wait times, appointment scheduling options and driving directions:  Urgent Care - [place], [place], [place], [place], [place], [place]  [place]     Your MyChart E-visit questionnaire answers were reviewed by a board certified advanced clinical practitioner to complete your personal care plan based on your specific symptoms.    Thank you for using e-Visits.

## 2024-03-18 ENCOUNTER — Other Ambulatory Visit (HOSPITAL_COMMUNITY)
Admission: RE | Admit: 2024-03-18 | Discharge: 2024-03-18 | Disposition: A | Discharge status: Home / Self Care | Source: Ambulatory Visit | Attending: Obstetrics & Gynecology | Admitting: Obstetrics & Gynecology

## 2024-03-18 ENCOUNTER — Ambulatory Visit: Admitting: *Deleted

## 2024-03-18 DIAGNOSIS — N898 Other specified noninflammatory disorders of vagina: Secondary | ICD-10-CM | POA: Insufficient documentation

## 2024-03-18 NOTE — Progress Notes (Signed)
   NURSE VISIT- VAGINITIS/STD/POC  SUBJECTIVE:  Kathleen Schmidt is a 28 y.o. G1P1001 GYN patientfemale here for a vaginal swab for vaginitis screening, STD screen.  She reports the following symptoms: discharge described as white and vulvar itching for 1 month. Denies abnormal vaginal bleeding, significant pelvic pain, fever, or UTI symptoms.  OBJECTIVE:  There were no vitals taken for this visit.  Appears well, in no apparent distress  ASSESSMENT: Vaginal swab for STD screen  PLAN: Self-collected vaginal probe for Gonorrhea, Chlamydia, Trichomonas, Bacterial Vaginosis, Yeast sent to lab Treatment: to be determined once results are received Follow-up as needed if symptoms persist/worsen, or new symptoms develop  Kerrie Peek  03/18/2024 11:46 AM

## 2024-03-19 ENCOUNTER — Other Ambulatory Visit: Payer: Self-pay | Admitting: Women's Health

## 2024-03-19 ENCOUNTER — Encounter: Payer: Self-pay | Admitting: Women's Health

## 2024-03-19 LAB — CERVICOVAGINAL ANCILLARY ONLY
Bacterial Vaginitis (gardnerella): NEGATIVE
Candida Glabrata: POSITIVE — AB
Candida Vaginitis: POSITIVE — AB
Chlamydia: NEGATIVE
Comment: NEGATIVE
Comment: NEGATIVE
Comment: NEGATIVE
Comment: NEGATIVE
Comment: NEGATIVE
Comment: NORMAL
Neisseria Gonorrhea: NEGATIVE
Trichomonas: NEGATIVE

## 2024-03-19 MED ORDER — BORIC ACID VAGINAL 600 MG VA SUPP: 600.0000 mg | Freq: Every day | VAGINAL | 0 refills | Status: DC

## 2024-04-30 ENCOUNTER — Ambulatory Visit

## 2024-06-19 ENCOUNTER — Ambulatory Visit: Admitting: Advanced Practice Midwife

## 2024-06-26 ENCOUNTER — Encounter: Payer: Self-pay | Admitting: Women's Health

## 2024-06-29 DIAGNOSIS — L255 Unspecified contact dermatitis due to plants, except food: Secondary | ICD-10-CM | POA: Diagnosis not present

## 2024-06-29 DIAGNOSIS — R21 Rash and other nonspecific skin eruption: Secondary | ICD-10-CM | POA: Diagnosis not present

## 2024-07-07 ENCOUNTER — Other Ambulatory Visit (HOSPITAL_COMMUNITY)
Admission: RE | Admit: 2024-07-07 | Discharge: 2024-07-07 | Disposition: A | Discharge status: Home / Self Care | Source: Ambulatory Visit | Attending: Obstetrics & Gynecology | Admitting: Obstetrics & Gynecology

## 2024-07-07 ENCOUNTER — Ambulatory Visit (INDEPENDENT_AMBULATORY_CARE_PROVIDER_SITE_OTHER): Admitting: *Deleted

## 2024-07-07 DIAGNOSIS — Z113 Encounter for screening for infections with a predominantly sexual mode of transmission: Secondary | ICD-10-CM

## 2024-07-07 DIAGNOSIS — N898 Other specified noninflammatory disorders of vagina: Secondary | ICD-10-CM

## 2024-07-07 NOTE — Progress Notes (Signed)
   NURSE VISIT- VAGINITIS/STD/POC  SUBJECTIVE:  Kathleen Schmidt is a 28 y.o. G1P1001 GYN patientfemale here for a vaginal swab for STD screen.  She reports the following symptoms: discharge described as yellow for several weeks. Denies abnormal vaginal bleeding, significant pelvic pain, fever, or UTI symptoms.  OBJECTIVE:  There were no vitals taken for this visit.  Appears well, in no apparent distress  ASSESSMENT: Vaginal swab for STD screen  PLAN: Self-collected vaginal probe for Gonorrhea, Chlamydia, Trichomonas, Bacterial Vaginosis, Yeast sent to lab Treatment: to be determined once results are received Follow-up as needed if symptoms persist/worsen, or new symptoms develop  Alan LITTIE Fischer  07/07/2024 2:46 PM

## 2024-07-09 ENCOUNTER — Ambulatory Visit: Payer: Self-pay | Admitting: Adult Health

## 2024-07-09 LAB — CERVICOVAGINAL ANCILLARY ONLY
Bacterial Vaginitis (gardnerella): NEGATIVE
Candida Glabrata: NEGATIVE
Candida Vaginitis: NEGATIVE
Chlamydia: NEGATIVE
Comment: NEGATIVE
Comment: NEGATIVE
Comment: NEGATIVE
Comment: NEGATIVE
Comment: NEGATIVE
Comment: NORMAL
Neisseria Gonorrhea: NEGATIVE
Trichomonas: NEGATIVE

## 2024-08-06 ENCOUNTER — Ambulatory Visit (INDEPENDENT_AMBULATORY_CARE_PROVIDER_SITE_OTHER): Payer: Self-pay | Admitting: Obstetrics and Gynecology

## 2024-08-06 ENCOUNTER — Encounter: Payer: Self-pay | Admitting: Obstetrics and Gynecology

## 2024-08-06 VITALS — BP 118/75 | HR 82 | Ht 62.0 in | Wt 159.0 lb

## 2024-08-06 DIAGNOSIS — Z3042 Encounter for surveillance of injectable contraceptive: Secondary | ICD-10-CM

## 2024-08-06 DIAGNOSIS — Z01419 Encounter for gynecological examination (general) (routine) without abnormal findings: Secondary | ICD-10-CM

## 2024-08-06 DIAGNOSIS — Z3202 Encounter for pregnancy test, result negative: Secondary | ICD-10-CM

## 2024-08-06 LAB — POCT URINE PREGNANCY: Preg Test, Ur: NEGATIVE

## 2024-08-06 MED ORDER — MEDROXYPROGESTERONE ACETATE 150 MG/ML IM SUSY: PREFILLED_SYRINGE | INTRAMUSCULAR | 2 refills | Status: AC

## 2024-08-06 MED ORDER — MEDROXYPROGESTERONE ACETATE 150 MG/ML IM SUSY
150.0000 mg | PREFILLED_SYRINGE | Freq: Once | INTRAMUSCULAR | Status: DC
  Administered 2024-08-06: 150 mg via INTRAMUSCULAR

## 2024-08-06 NOTE — Progress Notes (Signed)
 ANNUAL EXAM Patient name: Kathleen Schmidt MRN [id number]  Date of birth: 13-Apr-1996 Chief Complaint:   Gynecologic Exam  History of Present Illness:   Kathleen Schmidt is a 28 y.o. G31P1001  female being seen today for a routine annual exam.  Current complaints: desires to restart depo provera , currently on cycle, started on Monday  Has had yellow d/c with occasional itching, treated previously not currently having symptoms   Patient's last menstrual period was 08/04/2024 (exact date).   Upstream - 08/06/24 1534       Pregnancy Intention Screening   Does the patient want to become pregnant in the next year? No    Does the patient's partner want to become pregnant in the next year? No    Would the patient like to discuss contraceptive options today? Yes      Contraception Wrap Up   Current Method No Contraceptive Precautions    End Method Hormonal Injection    Contraception Counseling Provided Yes         The pregnancy intention screening data noted above was reviewed. Potential methods of contraception were discussed. The patient elected to proceed with Hormonal Injection.   Gynecologic History Patient's last menstrual period was . Contraception:  Last Pap:2023 . Results were: Normal Last mammogram: n/a     08/06/2024    3:40 PM 02/25/2024    9:02 AM 01/06/2022    9:29 AM 04/28/2021   11:58 AM  Depression screen PHQ 2/9  Decreased Interest 1 0 1 0  Down, Depressed, Hopeless 1 0 1 0  PHQ - 2 Score 2 0 2 0  Altered sleeping 1 0 0 0  Tired, decreased energy 1 0 2 0  Change in appetite 1 0 2 1  Feeling bad or failure about yourself  0 0 1 0  Trouble concentrating 0 0 0 0  Moving slowly or fidgety/restless 0 0 0 0  Suicidal thoughts  0 0 0  PHQ-9 Score 5 0 7 1  Difficult doing work/chores  Not difficult at all          08/06/2024    3:41 PM 02/25/2024    9:02 AM 04/28/2021   11:59 AM  GAD 7 : Generalized Anxiety Score  Nervous, Anxious, on Edge 0 0 0  Control/stop  worrying 0 0 0  Worry too much - different things 0 0 0  Trouble relaxing 0 0 0  Restless 0 0 0  Easily annoyed or irritable 1 0 0  Afraid - awful might happen 0 0 0  Total GAD 7 Score 1 0 0  Anxiety Difficulty  Not difficult at all      Review of Systems:   Pertinent items are noted in HPI Denies any headaches, blurred vision, fatigue, shortness of breath, chest pain, abdominal pain, abnormal vaginal discharge/itching/odor/irritation, problems with periods, bowel movements, urination, or intercourse unless otherwise stated above. Pertinent History Reviewed:  Reviewed past medical,surgical, social and family history.  Reviewed problem list, medications and allergies. Physical Assessment:   Vitals:   08/06/24 1535  BP: 118/75  Pulse: 82  Weight: 159 lb (72.1 kg)  Height: 5' 2 (1.575 m)  Body mass index is 29.08 kg/m.        Physical Examination:   General appearance - well appearing, and in no distress  Mental status - alert, oriented   Psych:  She has a normal mood and affect  Skin - warm and dry, normal color  Chest - effort normal, all  lung fields clear to auscultation bilaterally  Heart - normal rate and regular rhythm  Neck:  midline trachea, no thyromegaly or nodules  Breasts - breasts appear normal, no suspicious masses, no skin or nipple changes or  axillary nodes  Abdomen - soft, nontender, nondistended  Pelvic - VULVA: normal appearing vulva with no masses, tenderness or lesions  VAGINA: normal appearing vagina with normal color and discharge, no lesions  CERVIX: normal appearing cervix without discharge or lesions, no CMT  UTERUS: uterus is felt to be normal size, shape, consistency and nontender   ADNEXA: No adnexal masses or tenderness noted.  Extremities:  No swelling or varicosities noted  Chaperone present for exam  Results for orders placed or performed in visit on 08/06/24 (from the past 24 hours)  POCT urine pregnancy   Collection Time: 08/06/24  3:47  PM  Result Value Ref Range   Preg Test, Ur Negative Negative    Assessment & Plan:  1. Encounter for annual routine gynecological examination (Primary) PAP UTD, due next year Mammogram at 41 Discussed vaginal d/c prevention methods, f/u with s&s   2. Pregnancy test negative - POCT urine pregnancy  3. Encounter for Depo-Provera  contraception Currently on cycle Restart depo today  - medroxyPROGESTERone  Acetate 150 MG/ML SUSY; Inject 1ml into the muscle every 3 months  Dispense: 1 mL; Refill: 2   Labs/procedures today:   Mammogram: @ 27yo, or sooner if problems Colonoscopy: @ 27yo, or sooner if problems  Orders Placed This Encounter  Procedures   POCT urine pregnancy    Meds:  Meds ordered this encounter  Medications   DISCONTD: medroxyPROGESTERone  Acetate SUSY 150 mg   medroxyPROGESTERone  Acetate 150 MG/ML SUSY    Sig: Inject 1ml into the muscle every 3 months    Dispense:  1 mL    Refill:  2    Follow-up: Return 3 month nurse visit depo, then one year annual.  Kathleen Daring, FNP

## 2024-08-27 ENCOUNTER — Ambulatory Visit: Admitting: Internal Medicine

## 2024-10-22 ENCOUNTER — Ambulatory Visit
Admission: EM | Admit: 2024-10-22 | Discharge: 2024-10-22 | Disposition: A | Discharge status: Home / Self Care | Payer: Self-pay | Attending: Family Medicine | Admitting: Family Medicine

## 2024-10-22 DIAGNOSIS — J069 Acute upper respiratory infection, unspecified: Secondary | ICD-10-CM

## 2024-10-22 MED ORDER — PROMETHAZINE-DM 6.25-15 MG/5ML PO SYRP
5.0000 mL | ORAL_SOLUTION | Freq: Four times a day (QID) | ORAL | 0 refills | Status: AC | PRN
Start: 2024-10-22 — End: ?

## 2024-10-22 MED ORDER — AZELASTINE HCL 0.1 % NA SOLN: 1.0000 | Freq: Two times a day (BID) | NASAL | 0 refills | Status: AC

## 2024-10-22 NOTE — ED Provider Notes (Signed)
 [facility]    CSN: [id number] Arrival date & time: 10/22/24  1512      History   Chief Complaint No chief complaint on file.   HPI Kathleen Schmidt is a 28 y.o. female.   Patient presenting today with about a week of runny nose, congestion, hoarseness, scratchy throat, fatigue, cough.  Denies chest pain, shortness of breath, abdominal pain, vomiting, diarrhea.  Trying DayQuil and NyQuil with minimal relief.  No known chronic pulmonary disease.    Past Medical History:  Diagnosis Date   Medical history non-contributory     Patient Active Problem List   Diagnosis Date Noted   Gastroesophageal reflux disease 02/25/2024   Intractable migraine without aura and without status migrainosus 02/25/2024    Past Surgical History:  Procedure Laterality Date   NO PAST SURGERIES      OB History     Gravida  1   Para  1   Term  1   Preterm  0   AB  0   Living  1      SAB  0   IAB  0   Ectopic  0   Multiple  0   Live Births  1            Home Medications    Prior to Admission medications   Medication Sig Start Date End Date Taking? Authorizing Provider  azelastine (ASTELIN) 0.1 % nasal spray Place 1 spray into both nostrils 2 (two) times daily. Use in each nostril as directed 10/22/24  Yes Stuart Vernell Norris, PA-C  promethazine-dextromethorphan (PROMETHAZINE-DM) 6.25-15 MG/5ML syrup Take 5 mLs by mouth 4 (four) times daily as needed. 10/22/24  Yes Stuart Vernell Norris, PA-C  medroxyPROGESTERone  Acetate 150 MG/ML SUSY Inject 1ml into the muscle every 3 months 08/06/24   Delores Nidia CROME, FNP    Family History Family History  Problem Relation Age of Onset   Kidney failure Mother    Hypertension Mother    Diabetes Mother    Hypercholesterolemia Mother    Diabetes Maternal Grandmother    Stroke Maternal Grandfather     Social History Social History   Tobacco Use   Smoking status: Former    Current packs/day: 0.00    Types:  Cigarettes    Quit date: 2019    Years since quitting: 6.8   Smokeless tobacco: Never  Vaping Use   Vaping status: Never Used  Substance Use Topics   Alcohol use: No   Drug use: No     Allergies   Patient has no known allergies.   Review of Systems Review of Systems PER HPI  Physical Exam Triage Vital Signs ED Triage Vitals  Encounter Vitals Group     BP 10/22/24 1520 105/68     Girls Systolic BP Percentile --      Girls Diastolic BP Percentile --      Boys Systolic BP Percentile --      Boys Diastolic BP Percentile --      Pulse Rate 10/22/24 1520 65     Resp 10/22/24 1520 20     Temp 10/22/24 1520 98.2 F (36.8 C)     Temp Source 10/22/24 1520 Oral     SpO2 10/22/24 1520 97 %     Weight --      Height --      Head Circumference --      Peak Flow --      Pain Score 10/22/24 1522 0  Pain Loc --      Pain Education --      Exclude from Growth Chart --    No data found.  Updated Vital Signs BP 105/68 (BP Location: Right Arm)   Pulse 65   Temp 98.2 F (36.8 C) (Oral)   Resp 20   SpO2 97%   Visual Acuity Right Eye Distance:   Left Eye Distance:   Bilateral Distance:    Right Eye Near:   Left Eye Near:    Bilateral Near:     Physical Exam Vitals and nursing note reviewed.  Constitutional:      Appearance: Normal appearance.  HENT:     Head: Atraumatic.     Right Ear: Tympanic membrane and external ear normal.     Left Ear: Tympanic membrane and external ear normal.     Nose: Rhinorrhea present.     Mouth/Throat:     Mouth: Mucous membranes are moist.     Pharynx: Posterior oropharyngeal erythema present.  Eyes:     Extraocular Movements: Extraocular movements intact.     Conjunctiva/sclera: Conjunctivae normal.  Cardiovascular:     Rate and Rhythm: Normal rate and regular rhythm.     Heart sounds: Normal heart sounds.  Pulmonary:     Effort: Pulmonary effort is normal.     Breath sounds: Normal breath sounds. No wheezing or rales.   Musculoskeletal:        General: Normal range of motion.     Cervical back: Normal range of motion and neck supple.  Skin:    General: Skin is warm and dry.  Neurological:     Mental Status: She is alert and oriented to person, place, and time.  Psychiatric:        Mood and Affect: Mood normal.        Thought Content: Thought content normal.      UC Treatments / Results  Labs (all labs ordered are listed, but only abnormal results are displayed) Labs Reviewed - No data to display  EKG   Radiology No results found.  Procedures Procedures (including critical care time)  Medications Ordered in UC Medications - No data to display  Initial Impression / Assessment and Plan / UC Course  I have reviewed the triage vital signs and the nursing notes.  Pertinent labs & imaging results that were available during my care of the patient were reviewed by me and considered in my medical decision making (see chart for details).     Vitals and exam reassuring today, consistent with lingering viral respiratory infection.  Treat with Astelin, Phenergan DM, supportive over-the-counter medications and home care.  Return for worsening or unresolving symptoms.  Final Clinical Impressions(s) / UC Diagnoses   Final diagnoses:  Viral URI with cough   Discharge Instructions   None    ED Prescriptions     Medication Sig Dispense Auth. Provider   azelastine (ASTELIN) 0.1 % nasal spray Place 1 spray into both nostrils 2 (two) times daily. Use in each nostril as directed 30 mL Stuart Vernell Norris, PA-C   promethazine-dextromethorphan (PROMETHAZINE-DM) 6.25-15 MG/5ML syrup Take 5 mLs by mouth 4 (four) times daily as needed. 100 mL Stuart Vernell Norris, [place]      PDMP not reviewed this encounter.   Stuart Vernell Norris, PA-C 10/22/24 [phone]

## 2024-10-22 NOTE — ED Triage Notes (Signed)
 Pt reports cough, congestion hoarseness and headache x 1 week. Started with fever and chills. Has tried OTC meds has found no relief.

## 2024-10-29 ENCOUNTER — Ambulatory Visit: Payer: Self-pay

## 2024-12-25 ENCOUNTER — Ambulatory Visit
Admission: EM | Admit: 2024-12-25 | Discharge: 2024-12-25 | Disposition: A | Discharge status: Home / Self Care | Payer: Self-pay | Attending: Nurse Practitioner | Admitting: Nurse Practitioner

## 2024-12-25 DIAGNOSIS — G43811 Other migraine, intractable, with status migrainosus: Secondary | ICD-10-CM

## 2024-12-25 DIAGNOSIS — Z8669 Personal history of other diseases of the nervous system and sense organs: Secondary | ICD-10-CM

## 2024-12-25 MED ORDER — KETOROLAC TROMETHAMINE 30 MG/ML IJ SOLN
30.0000 mg | Freq: Once | INTRAMUSCULAR | Status: AC
  Administered 2024-12-25: 30 mg via INTRAMUSCULAR

## 2024-12-25 NOTE — Discharge Instructions (Signed)
 You were given an injection of Toradol  30 mg.  Do not take any additional NSAIDs today.  You may take Tylenol  for breakthrough pain or discomfort. Take medication as prescribed. Increase fluids and allow for plenty of rest. Recommend sleeping in a dimly lit room and using cool cloth across your forehead until symptoms improve. Recommend keeping a headache diary so you can understand and learn triggers that may cause your symptoms. Go to the emergency department if you experience changes in your mental status, worsening headache, visual changes, or other concerns. Follow-up with your primary care physician for further evaluation.  Recommend discussing referral to neurology. Follow-up as needed.

## 2024-12-25 NOTE — ED Triage Notes (Signed)
 Pt reports she has been having a headache, left eye pain, and light sensitivity, nausea x 3 days .   Took tylenol  and excedrine

## 2024-12-25 NOTE — ED Provider Notes (Signed)
 " [facility]    CSN: [id number] Arrival date & time: 12/25/24  1854      History   Chief Complaint No chief complaint on file.   HPI Kathleen Schmidt is a 29 y.o. female.   The history is provided by the patient.   Patient presents for a 3-day history of headache pain.  She states that the pain is located on the right side of her head behind the right eye.  She states that since the onset of her symptoms, the headache has changed.  She also complains of pain in the right neck, nausea, light sensitivity, and sound sensitivity, and dizziness.  Denies visual changes, vomiting, abdominal pain, head injury, or trauma.  She reports that she does have underlying history of migraines.  States that her PCP did prescribe medications to help with her migraine headaches that worked well, but she did not provide the name.  So far, states she has been taking Tylenol  and Excedrin for her symptoms.  Past Medical History:  Diagnosis Date   Medical history non-contributory     Patient Active Problem List   Diagnosis Date Noted   Gastroesophageal reflux disease 02/25/2024   Intractable migraine without aura and without status migrainosus 02/25/2024    Past Surgical History:  Procedure Laterality Date   NO PAST SURGERIES      OB History     Gravida  1   Para  1   Term  1   Preterm  0   AB  0   Living  1      SAB  0   IAB  0   Ectopic  0   Multiple  0   Live Births  1            Home Medications    Prior to Admission medications  Medication Sig Start Date End Date Taking? Authorizing Provider  azelastine  (ASTELIN ) 0.1 % nasal spray Place 1 spray into both nostrils 2 (two) times daily. Use in each nostril as directed 10/22/24   Stuart Vernell Norris, PA-C  medroxyPROGESTERone  Acetate 150 MG/ML SUSY Inject 1ml into the muscle every 3 months 08/06/24   Delores Nidia CROME, FNP  promethazine -dextromethorphan (PROMETHAZINE -DM) 6.25-15 MG/5ML syrup Take 5 mLs by  mouth 4 (four) times daily as needed. 10/22/24   Stuart Vernell Norris, PA-C    Family History Family History  Problem Relation Age of Onset   Kidney failure Mother    Hypertension Mother    Diabetes Mother    Hypercholesterolemia Mother    Diabetes Maternal Grandmother    Stroke Maternal Grandfather     Social History Social History[1]   Allergies   Patient has no known allergies.   Review of Systems Review of Systems Per HPI  Physical Exam Triage Vital Signs ED Triage Vitals  Encounter Vitals Group     BP 12/25/24 1903 103/68     Girls Systolic BP Percentile --      Girls Diastolic BP Percentile --      Boys Systolic BP Percentile --      Boys Diastolic BP Percentile --      Pulse Rate 12/25/24 1903 62     Resp 12/25/24 1903 18     Temp 12/25/24 1903 98.6 F (37 C)     Temp Source 12/25/24 1903 Oral     SpO2 12/25/24 1903 97 %     Weight --      Height --  Head Circumference --      Peak Flow --      Pain Score 12/25/24 1905 7     Pain Loc --      Pain Education --      Exclude from Growth Chart --    No data found.  Updated Vital Signs BP 103/68 (BP Location: Right Arm)   Pulse 62   Temp 98.6 F (37 C) (Oral)   Resp 18   LMP 12/25/2024   SpO2 97%   Visual Acuity Right Eye Distance:   Left Eye Distance:   Bilateral Distance:    Right Eye Near:   Left Eye Near:    Bilateral Near:     Physical Exam Vitals and nursing note reviewed.  Constitutional:      Appearance: Normal appearance.  HENT:     Head: Normocephalic.     Right Ear: Tympanic membrane, ear canal and external ear normal.     Left Ear: Tympanic membrane, ear canal and external ear normal.     Mouth/Throat:     Mouth: Mucous membranes are moist.  Eyes:     Extraocular Movements: Extraocular movements intact.     Pupils: Pupils are equal, round, and reactive to light.  Cardiovascular:     Rate and Rhythm: Normal rate and regular rhythm.     Pulses: Normal pulses.      Heart sounds: Normal heart sounds.  Pulmonary:     Effort: Pulmonary effort is normal. No respiratory distress.     Breath sounds: Normal breath sounds. No stridor. No wheezing, rhonchi or rales.  Abdominal:     General: Bowel sounds are normal.     Palpations: Abdomen is soft.     Tenderness: There is no abdominal tenderness.  Musculoskeletal:     Cervical back: Normal range of motion.  Skin:    General: Skin is warm and dry.  Neurological:     General: No focal deficit present.     Mental Status: She is alert and oriented to person, place, and time.  Psychiatric:        Mood and Affect: Mood normal.        Behavior: Behavior normal.      UC Treatments / Results  Labs (all labs ordered are listed, but only abnormal results are displayed) Labs Reviewed - No data to display  EKG   Radiology No results found.  Procedures Procedures (including critical care time)  Medications Ordered in UC Medications  ketorolac  (TORADOL ) 30 MG/ML injection 30 mg (30 mg Intramuscular Given 12/25/24 1920)    Initial Impression / Assessment and Plan / UC Course  I have reviewed the triage vital signs and the nursing notes.  Pertinent labs & imaging results that were available during my care of the patient were reviewed by me and considered in my medical decision making (see chart for details).  On exam, the patient is well-appearing, she is in no acute distress, vital signs are stable.  Neurological exam was within normal limits, no deficits were noted.  Patient with underlying history of migraines, symptoms are consistent with same.  Toradol  30 mg IM administered.  Will treat with Zofran  4 mg ODT for ongoing nausea.  Supportive care recommendations were provided and discussed with the patient to include sleeping in a dimly lit room, keeping a headache diary, and using cool cloths across her forehead.  Patient was given strict ER follow-up precautions, along with indications to follow-up with  her PCP.  Patient was in agreement with this plan of care and verbalizes understanding.  All questions were answered.  Patient stable for discharge.  Final Clinical Impressions(s) / UC Diagnoses   Final diagnoses:  Other migraine with status migrainosus, intractable  History of migraine     Discharge Instructions      You were given an injection of Toradol  30 mg.  Do not take any additional NSAIDs today.  You may take Tylenol  for breakthrough pain or discomfort. Take medication as prescribed. Increase fluids and allow for plenty of rest. Recommend sleeping in a dimly lit room and using cool cloth across your forehead until symptoms improve. Recommend keeping a headache diary so you can understand and learn triggers that may cause your symptoms. Go to the emergency department if you experience changes in your mental status, worsening headache, visual changes, or other concerns. Follow-up with your primary care physician for further evaluation.  Recommend discussing referral to neurology. Follow-up as needed.     ED Prescriptions   None    PDMP not reviewed this encounter.     [1]  Social History Tobacco Use   Smoking status: Former    Current packs/day: 0.00    Types: Cigarettes    Quit date: 2019    Years since quitting: 7.0   Smokeless tobacco: Never  Vaping Use   Vaping status: Never Used  Substance Use Topics   Alcohol use: No   Drug use: No     Gilmer Etta PARAS, NP 12/25/24 1927  "
